# Patient Record
Sex: Female | Born: 1985 | Hispanic: No | Marital: Married | Smoking: Never smoker
Health system: Southern US, Community
[De-identification: ages and names within clinical notes are randomized; demographics above are authoritative.]

## PROBLEM LIST (undated history)

## (undated) ENCOUNTER — Inpatient Hospital Stay (HOSPITAL_COMMUNITY): Payer: Self-pay

## (undated) DIAGNOSIS — Z789 Other specified health status: Secondary | ICD-10-CM

## (undated) HISTORY — PX: NO PAST SURGERIES: SHX2092

---

## 2012-09-15 ENCOUNTER — Encounter (HOSPITAL_BASED_OUTPATIENT_CLINIC_OR_DEPARTMENT_OTHER): Payer: Self-pay | Admitting: *Deleted

## 2012-09-15 ENCOUNTER — Emergency Department (HOSPITAL_BASED_OUTPATIENT_CLINIC_OR_DEPARTMENT_OTHER): Payer: Worker's Compensation

## 2012-09-15 DIAGNOSIS — W278XXA Contact with other nonpowered hand tool, initial encounter: Secondary | ICD-10-CM | POA: Insufficient documentation

## 2012-09-15 DIAGNOSIS — Y9269 Other specified industrial and construction area as the place of occurrence of the external cause: Secondary | ICD-10-CM | POA: Insufficient documentation

## 2012-09-15 DIAGNOSIS — Y990 Civilian activity done for income or pay: Secondary | ICD-10-CM | POA: Insufficient documentation

## 2012-09-15 DIAGNOSIS — S61209A Unspecified open wound of unspecified finger without damage to nail, initial encounter: Secondary | ICD-10-CM | POA: Insufficient documentation

## 2012-09-15 DIAGNOSIS — Z23 Encounter for immunization: Secondary | ICD-10-CM | POA: Insufficient documentation

## 2012-09-15 DIAGNOSIS — Y939 Activity, unspecified: Secondary | ICD-10-CM | POA: Insufficient documentation

## 2012-09-15 NOTE — ED Notes (Signed)
Pt was at work tonight and was stabbed in the left index finger with an embroidery needle. No bleeding noted.

## 2012-09-16 ENCOUNTER — Emergency Department (HOSPITAL_BASED_OUTPATIENT_CLINIC_OR_DEPARTMENT_OTHER)
Admission: EM | Admit: 2012-09-16 | Discharge: 2012-09-16 | Disposition: A | Discharge status: Home / Self Care | Payer: Worker's Compensation | Attending: Emergency Medicine | Admitting: Emergency Medicine

## 2012-09-16 DIAGNOSIS — T148XXA Other injury of unspecified body region, initial encounter: Secondary | ICD-10-CM

## 2012-09-16 MED ORDER — CEPHALEXIN 250 MG PO CAPS: 250.0000 mg | ORAL_CAPSULE | Freq: Four times a day (QID) | ORAL | Status: DC

## 2012-09-16 MED ORDER — TETANUS-DIPHTH-ACELL PERTUSSIS 5-2.5-18.5 LF-MCG/0.5 IM SUSP
0.5000 mL | Freq: Once | INTRAMUSCULAR | Status: AC
  Administered 2012-09-16: 0.5 mL via INTRAMUSCULAR
  Filled 2012-09-16: qty 0.5

## 2012-09-16 NOTE — ED Provider Notes (Signed)
History     CSN: [id number]  Arrival date & time 09/15/12  2302   First MD Initiated Contact with Patient 09/16/12 0209      Chief Complaint  Patient presents with  . Finger Injury    (Consider location/radiation/quality/duration/timing/severity/associated sxs/prior treatment) Patient is a 27 y.o. female presenting with hand pain. The history is provided by the patient.  Hand Pain This is a new problem. The current episode started less than 1 hour ago. The problem occurs constantly. The problem has not changed since onset.Pertinent negatives include no chest pain, no abdominal pain, no headaches and no shortness of breath. Nothing aggravates the symptoms. Nothing relieves the symptoms. She has tried nothing for the symptoms. The treatment provided no relief.  Pricked left index finger with sewing machine needle.  No pain nor bleeding currently  History reviewed. No pertinent past medical history.  History reviewed. No pertinent past surgical history.  No family history on file.  History  Substance Use Topics  . Smoking status: Never Smoker   . Smokeless tobacco: Not on file  . Alcohol Use: No    OB History    Grav Para Term Preterm Abortions TAB SAB Ect Mult Living                  Review of Systems  Respiratory: Negative for shortness of breath.   Cardiovascular: Negative for chest pain.  Gastrointestinal: Negative for abdominal pain.  Neurological: Negative for headaches.  All other systems reviewed and are negative.    Allergies  Review of patient's allergies indicates no known allergies.  Home Medications  No current outpatient prescriptions on file.  BP 124/66  Pulse 72  Temp 98.1 F (36.7 C) (Oral)  Resp 18  SpO2 100%  LMP 09/08/2012  Physical Exam  Constitutional: She is oriented to person, place, and time. She appears well-developed and well-nourished. No distress.  HENT:  Head: Normocephalic and atraumatic.  Eyes: Conjunctivae normal are  normal. Pupils are equal, round, and reactive to light.  Neck: Normal range of motion. Neck supple.  Cardiovascular: Normal rate, regular rhythm and intact distal pulses.   Pulmonary/Chest: Effort normal and breath sounds normal. She has no wheezes. She has no rales.  Abdominal: Soft. Bowel sounds are normal. There is no tenderness.  Musculoskeletal: Normal range of motion.  Neurological: She is alert and oriented to person, place, and time.  Skin: Skin is warm and dry.  Psychiatric: She has a normal mood and affect.    ED Course  Procedures (including critical care time)  Labs Reviewed - No data to display Dg Finger Index Left  09/16/2012  *RADIOLOGY REPORT*  Clinical Data: Needle injury.  LEFT INDEX FINGER 2+V  Comparison: None.  Findings: The mineralization and alignment are normal.  There is no evidence of acute fracture or dislocation.  There is no evidence of metallic foreign body or soft tissue emphysema.  There is no bone destruction.  IMPRESSION: No acute osseous findings or foreign body.   Original Report Authenticated By: Carey Bullocks, M.D.      No diagnosis found.    MDM  Prick to tip of the left index finger.  Tetanus updated wound care and bacitracin applied.  Apply neosporin to wound.  Will give rx for keflex        [name], MD 09/16/12 [phone]

## 2014-12-22 ENCOUNTER — Ambulatory Visit (INDEPENDENT_AMBULATORY_CARE_PROVIDER_SITE_OTHER): Payer: 59 | Admitting: Physician Assistant

## 2014-12-22 VITALS — BP 110/80 | HR 63 | Temp 97.8°F | Ht 65.0 in | Wt 141.4 lb

## 2014-12-22 DIAGNOSIS — L309 Dermatitis, unspecified: Secondary | ICD-10-CM

## 2014-12-22 MED ORDER — TRIAMCINOLONE ACETONIDE 0.025 % EX OINT: 1.0000 "application " | TOPICAL_OINTMENT | Freq: Two times a day (BID) | CUTANEOUS | Status: DC

## 2014-12-22 MED ORDER — KETOCONAZOLE 2 % EX CREA: 1.0000 "application " | TOPICAL_CREAM | Freq: Every day | CUTANEOUS | Status: DC

## 2014-12-22 NOTE — Progress Notes (Signed)
   Subjective:    Patient ID: Carly [name], female    DOB: 09/03/1986, 29 y.o.   MRN: [id number]  Chief Complaint  Patient presents with  . Facial Pain    Reports of facial dryness-?allergies   There are no active problems to display for this patient.  Medications, allergies, past medical history, surgical history, family history, social history and problem list reviewed and updated.  HPI  29 yof with no significant pmh presents with facial dryness, itching past 3 wks.   Initially felt face getting dry few wks ago, tried different lotions but all bothered her. Past week has felt like face is mildly itchy and burning. Both cheeks.   Denies uri sx. Denies st, trouble swallowing, swollen lips. Left eye was puffy last week but better today. Denies fatigue, fever, wt loss, muscle aches, painful oral lesions. No fam hx lupus. Denies joint pains.   Denies dry skin, burning elsewhere.   Review of Systems See HPI.     Objective:   Physical Exam  Constitutional: She appears well-developed and well-nourished.  Non-toxic appearance. She does not have a sickly appearance. She does not appear ill. No distress.  BP 110/80 mmHg  Pulse 63  Temp(Src) 97.8 F (36.6 C) (Oral)  Ht 5\' 5"  (1.651 m)  Wt 141 lb 6 oz (64.127 kg)  BMI 23.53 kg/m2  SpO2 98%   HENT:  Head:    Right Ear: Tympanic membrane normal.  Left Ear: Tympanic membrane normal.  Nose: Nose normal.  Mouth/Throat: Uvula is midline and oropharynx is clear and moist.  Mild erythema, papules, dry skin over circled areas. No crusting, greasy lesions, purulence.   Eyes: Conjunctivae and EOM are normal. Pupils are equal, round, and reactive to light.      Assessment & Plan:   29 yof with no significant pmh presents with facial dryness, itching past 3 wks.   Dermatitis - Plan: triamcinolone (KENALOG) 0.025 % ointment, ketoconazole (NIZORAL) 2 % cream --triamcinolone (low potency) bid 1-2 wks over irritated areas, pt instructed not  to extend past this time as can thin skin in the area --ketoconazole qd next 1-2 wks as pt states this has provided some relief in the past --daily moisturizer like eucerin after showers, face washing --daily zyrtec --rtc 2 wks if not improving  Donnajean Lopesodd M. Armya Westerhoff, PA-C Physician Assistant-Certified [facility]  12/23/2014 7:39 PM

## 2014-12-22 NOTE — Patient Instructions (Addendum)
Please apply the steroid topical cream twice daily for the next 1-2 weeks.  Please apply the ketoconazole topical once daily for the next 1-2 weeks. Be sure to apply moisturizer such as eucerin or cetaphil twice daily especially after showers every day. Please take an oral antihistamine like zyrtec every day for the next few weeks as this may help with the irritation.  If you're not feeling better in 2 weeks after these measures please come back to see us for further workup.

## 2014-12-23 ENCOUNTER — Encounter: Payer: Self-pay | Admitting: Physician Assistant

## 2015-05-31 ENCOUNTER — Inpatient Hospital Stay (HOSPITAL_COMMUNITY): Payer: 59

## 2015-05-31 ENCOUNTER — Encounter (HOSPITAL_COMMUNITY): Payer: Self-pay | Admitting: *Deleted

## 2015-05-31 ENCOUNTER — Inpatient Hospital Stay (HOSPITAL_COMMUNITY)
Admission: AD | Admit: 2015-05-31 | Discharge: 2015-06-01 | Disposition: A | Discharge status: Home / Self Care | Payer: 59 | Source: Ambulatory Visit | Attending: Obstetrics and Gynecology | Admitting: Obstetrics and Gynecology

## 2015-05-31 DIAGNOSIS — O200 Threatened abortion: Secondary | ICD-10-CM | POA: Insufficient documentation

## 2015-05-31 DIAGNOSIS — R109 Unspecified abdominal pain: Secondary | ICD-10-CM | POA: Diagnosis present

## 2015-05-31 DIAGNOSIS — R58 Hemorrhage, not elsewhere classified: Secondary | ICD-10-CM

## 2015-05-31 DIAGNOSIS — Z3A01 Less than 8 weeks gestation of pregnancy: Secondary | ICD-10-CM | POA: Insufficient documentation

## 2015-05-31 HISTORY — DX: Other specified health status: Z78.9

## 2015-05-31 LAB — CBC WITH DIFFERENTIAL/PLATELET
BASOS ABS: 0 10*3/uL (ref 0.0–0.1)
Basophils Relative: 0 %
EOS PCT: 5 %
Eosinophils Absolute: 0.5 10*3/uL (ref 0.0–0.7)
HEMATOCRIT: 34 % — AB (ref 36.0–46.0)
Hemoglobin: 11.4 g/dL — ABNORMAL LOW (ref 12.0–15.0)
LYMPHS PCT: 23 %
Lymphs Abs: 2.7 10*3/uL (ref 0.7–4.0)
MCH: 29.5 pg (ref 26.0–34.0)
MCHC: 33.5 g/dL (ref 30.0–36.0)
MCV: 88.1 fL (ref 78.0–100.0)
MONO ABS: 0.9 10*3/uL (ref 0.1–1.0)
MONOS PCT: 7 %
NEUTROS ABS: 7.7 10*3/uL (ref 1.7–7.7)
Neutrophils Relative %: 65 %
PLATELETS: 254 10*3/uL (ref 150–400)
RBC: 3.86 MIL/uL — ABNORMAL LOW (ref 3.87–5.11)
RDW: 12.9 % (ref 11.5–15.5)
WBC: 11.8 10*3/uL — ABNORMAL HIGH (ref 4.0–10.5)

## 2015-05-31 LAB — URINE MICROSCOPIC-ADD ON

## 2015-05-31 LAB — POCT PREGNANCY, URINE: PREG TEST UR: POSITIVE — AB

## 2015-05-31 LAB — URINALYSIS, ROUTINE W REFLEX MICROSCOPIC
BILIRUBIN URINE: NEGATIVE
GLUCOSE, UA: NEGATIVE mg/dL
Ketones, ur: NEGATIVE mg/dL
Nitrite: NEGATIVE
Protein, ur: NEGATIVE mg/dL
SPECIFIC GRAVITY, URINE: 1.02 (ref 1.005–1.030)
UROBILINOGEN UA: 0.2 mg/dL (ref 0.0–1.0)
pH: 6 (ref 5.0–8.0)

## 2015-05-31 LAB — HCG, QUANTITATIVE, PREGNANCY: hCG, Beta Chain, Quant, S: 3855 m[IU]/mL — ABNORMAL HIGH (ref <5)

## 2015-05-31 NOTE — Discharge Instructions (Signed)
Abnormal Uterine Bleeding °Abnormal uterine bleeding can affect women at various stages in life, including teenagers, women in their reproductive years, pregnant women, and women who have reached menopause. Several kinds of uterine bleeding are considered abnormal, including: °· Bleeding or spotting between periods.   °· Bleeding after sexual intercourse.   °· Bleeding that is heavier or more than normal.   °· Periods that last longer than usual. °· Bleeding after menopause.   °Many cases of abnormal uterine bleeding are minor and simple to treat, while others are more serious. Any type of abnormal bleeding should be evaluated by your health care provider. Treatment will depend on the cause of the bleeding. °HOME CARE INSTRUCTIONS °Monitor your condition for any changes. The following actions may help to alleviate any discomfort you are experiencing: °· Avoid the use of tampons and douches as directed by your health care provider. °· Change your pads frequently. °You should get regular pelvic exams and Pap tests. Keep all follow-up appointments for diagnostic tests as directed by your health care provider.  °SEEK MEDICAL CARE IF:  °· Your bleeding lasts more than 1 week.   °· You feel dizzy at times.   °SEEK IMMEDIATE MEDICAL CARE IF:  °· You pass out.   °· You are changing pads every 15 to 30 minutes.   °· You have abdominal pain. °· You have a fever.   °· You become sweaty or weak.   °· You are passing large blood clots from the vagina.   °· You start to feel nauseous and vomit. °MAKE SURE YOU:  °· Understand these instructions. °· Will watch your condition. °· Will get help right away if you are not doing well or get worse. °Document Released: 08/31/2005 Document Revised: 09/05/2013 Document Reviewed: 03/30/2013 °ExitCare® Patient Information ©2015 [employer]. This information is not intended to replace advice given to you by your health care provider. Make sure you discuss any questions you have with your  health care provider. °Miscarriage °A miscarriage is the sudden loss of an unborn baby (fetus) before the 20th week of pregnancy. Most miscarriages happen in the first 3 months of pregnancy. Sometimes, it happens before a woman even knows she is pregnant. A miscarriage is also called a "spontaneous miscarriage" or "early pregnancy loss." Having a miscarriage can be an emotional experience. Talk with your caregiver about any questions you may have about miscarrying, the grieving process, and your future pregnancy plans. °CAUSES  °· Problems with the fetal chromosomes that make it impossible for the baby to develop normally. Problems with the baby's genes or chromosomes are most often the result of errors that occur, by chance, as the embryo divides and grows. The problems are not inherited from the parents. °· Infection of the cervix or uterus.   °· Hormone problems.   °· Problems with the cervix, such as having an incompetent cervix. This is when the tissue in the cervix is not strong enough to hold the pregnancy.   °· Problems with the uterus, such as an abnormally shaped uterus, uterine fibroids, or congenital abnormalities.   °· Certain medical conditions.   °· Smoking, drinking alcohol, or taking illegal drugs.   °· Trauma.   °Often, the cause of a miscarriage is unknown.  °SYMPTOMS  °· Vaginal bleeding or spotting, with or without cramps or pain. °· Pain or cramping in the abdomen or lower back. °· Passing fluid, tissue, or blood clots from the vagina. °DIAGNOSIS  °Your caregiver will perform a physical exam. You may also have an ultrasound to confirm the miscarriage. Blood or urine tests may also   be ordered. °TREATMENT  °· Sometimes, treatment is not necessary if you naturally pass all the fetal tissue that was in the uterus. If some of the fetus or placenta remains in the body (incomplete miscarriage), tissue left behind may become infected and must be removed. Usually, a dilation and curettage (D and C)  procedure is performed. During a D and C procedure, the cervix is widened (dilated) and any remaining fetal or placental tissue is gently removed from the uterus. °· Antibiotic medicines are prescribed if there is an infection. Other medicines may be given to reduce the size of the uterus (contract) if there is a lot of bleeding. °· If you have Rh negative blood and your baby was Rh positive, you will need a Rh immunoglobulin shot. This shot will protect any future baby from having Rh blood problems in future pregnancies. °HOME CARE INSTRUCTIONS  °· Your caregiver may order bed rest or may allow you to continue light activity. Resume activity as directed by your caregiver. °· Have someone help with home and family responsibilities during this time.   °· Keep track of the number of sanitary pads you use each day and how soaked (saturated) they are. Write down this information.   °· Do not use tampons. Do not douche or have sexual intercourse until approved by your caregiver.   °· Only take over-the-counter or prescription medicines for pain or discomfort as directed by your caregiver.   °· Do not take aspirin. Aspirin can cause bleeding.   °· Keep all follow-up appointments with your caregiver.   °· If you or your partner have problems with grieving, talk to your caregiver or seek counseling to help cope with the pregnancy loss. Allow enough time to grieve before trying to get pregnant again.   °SEEK IMMEDIATE MEDICAL CARE IF:  °· You have severe cramps or pain in your back or abdomen. °· You have a fever. °· You pass large blood clots (walnut-sized or larger) or tissue from your vagina. Save any tissue for your caregiver to inspect.   °· Your bleeding increases.   °· You have a thick, bad-smelling vaginal discharge. °· You become lightheaded, weak, or you faint.   °· You have chills.   °MAKE SURE YOU: °· Understand these instructions. °· Will watch your condition. °· Will get help right away if you are not doing  well or get worse. °Document Released: 02/24/2001 Document Revised: 12/26/2012 Document Reviewed: 10/20/2011 °ExitCare® Patient Information ©2015 [employer]. This information is not intended to replace advice given to you by your health care provider. Make sure you discuss any questions you have with your health care provider. ° °

## 2015-05-31 NOTE — MAU Provider Note (Signed)
Carly [name] is a 29 y.o. NOB visit schedule for 06/11/15.  GA 7.1 week by date. Ultrasound 05/30/15 - [facility]  [redacted] weeks gestation with FHR = 67 bpm.  She presents to MAU to c/o increase bleeding and cramping today but denies cramping since she arrived in MAU.  Quant on 05/29/15   6272.2.     History     There are no active problems to display for this patient.   Chief Complaint  Patient presents with  . Abdominal Pain  . Vaginal Bleeding   HPI  OB History    Gravida Para Term Preterm AB TAB SAB Ectopic Multiple Living   1               Past Medical History  Diagnosis Date  . Medical history non-contributory     Past Surgical History  Procedure Laterality Date  . No past surgeries      Family History  Problem Relation Age of Onset  . Cancer Maternal Grandfather   . Heart disease Paternal Grandfather     Social History  Substance Use Topics  . Smoking status: Never Smoker   . Smokeless tobacco: None  . Alcohol Use: No    Allergies: No Known Allergies  Prescriptions prior to admission  Medication Sig Dispense Refill Last Dose  . acetaminophen (TYLENOL) 500 MG tablet Take 500 mg by mouth every 6 (six) hours as needed.   05/31/2015 at 1630  . Prenatal Vit-Fe Fumarate-FA (PRENATAL MULTIVITAMIN) TABS tablet Take 1 tablet by mouth daily at 12 noon.   05/31/2015 at Unknown time  . ketoconazole (NIZORAL) 2 % cream Apply 1 application topically daily. 15 g 0   . triamcinolone (KENALOG) 0.025 % ointment Apply 1 application topically 2 (two) times daily. 30 g 0     ROS See HPI above, all other systems are negative  Physical Exam   Blood pressure 114/68, pulse 78, temperature 98.6 F (37 C), resp. rate 18, height 5\' 5"  (1.651 m), weight 143 lb (64.864 kg), last menstrual period 04/10/2015.  Physical Exam Ext:  WNL ABD: Soft, non tender to palpation, no rebound or guarding SSE: some bleeding observed, 1-2 cm firm round tissue like substance with trailing tissue in the  vaginal vault, which expelled with very little effort   ED Course  Assessment: [facility]UP at  6 weeks by [place] 05/30/15 Suspected miscarriage     Plan: Labs: CBC, Quant [place] for confirmation of SAB Tissue sample sent to pathology   Venus Standard, CNM, MSN 05/31/2015. 11:07 PM    MAU Addendum Note  Results for orders placed or performed during the hospital encounter of 05/31/15 (from the past 24 hour(s))  Urinalysis, Routine w reflex microscopic (not at [facility])     Status: Abnormal   Collection Time: 05/31/15  8:45 PM  Result Value Ref Range   Color, Urine ORANGE (A) YELLOW   APPearance CLEAR CLEAR   Specific Gravity, Urine 1.020 1.005 - 1.030   pH 6.0 5.0 - 8.0   Glucose, UA NEGATIVE NEGATIVE mg/dL   Hgb urine dipstick LARGE (A) NEGATIVE   Bilirubin Urine NEGATIVE NEGATIVE   Ketones, ur NEGATIVE NEGATIVE mg/dL   Protein, ur NEGATIVE NEGATIVE mg/dL   Urobilinogen, UA 0.2 0.0 - 1.0 mg/dL   Nitrite NEGATIVE NEGATIVE   Leukocytes, UA TRACE (A) NEGATIVE  Urine microscopic-add on     Status: Abnormal   Collection Time: 05/31/15  8:45 PM  Result Value Ref Range   Squamous Epithelial /  LPF RARE RARE   WBC, UA 3-6 <3 WBC/hpf   RBC / HPF 21-50 <3 RBC/hpf   Bacteria, UA FEW (A) RARE  Pregnancy, urine POC     Status: Abnormal   Collection Time: 05/31/15  8:52 PM  Result Value Ref Range   Preg Test, Ur POSITIVE (A) NEGATIVE  CBC with Differential/Platelet     Status: Abnormal   Collection Time: 05/31/15  9:19 PM  Result Value Ref Range   WBC 11.8 (H) 4.0 - 10.5 K/uL   RBC 3.86 (L) 3.87 - 5.11 MIL/uL   Hemoglobin 11.4 (L) 12.0 - 15.0 g/dL   HCT 30.8 (L) 65.7 - 84.6 %   MCV 88.1 78.0 - 100.0 fL   MCH 29.5 26.0 - 34.0 pg   MCHC 33.5 30.0 - 36.0 g/dL   RDW 96.2 95.2 - 84.1 %   Platelets 254 150 - 400 K/uL   Neutrophils Relative % 65 %   Neutro Abs 7.7 1.7 - 7.7 K/uL   Lymphocytes Relative 23 %   Lymphs Abs 2.7 0.7 - 4.0 K/uL   Monocytes Relative 7 %   Monocytes Absolute 0.9 0.1 -  1.0 K/uL   Eosinophils Relative 5 %   Eosinophils Absolute 0.5 0.0 - 0.7 K/uL   Basophils Relative 0 %   Basophils Absolute 0.0 0.0 - 0.1 K/uL  hCG, quantitative, pregnancy     Status: Abnormal   Collection Time: 05/31/15  9:19 PM  Result Value Ref Range   hCG, Beta Chain, Quant, S 3855 (H) <5 mIU/mL   [place] results No intrauterine pregnancy. Thickened endometrium, without blood flow to suggest retained products. Findings are consistent with failed pregnancy.  Plan: -Discussed need to follow up in office in  5 days for FU quant -Bleeding Precautions -Encouraged to call if any questions or concerns arise prior to next scheduled office visit.  -Discharged to home in stable condition -pt encourage to delay conception for 3-4 months    Venus Standard, CNM, MSN 05/31/2015. 11:07 PM

## 2015-05-31 NOTE — Progress Notes (Signed)
V. Standard CNM aware u/s results back and coming to discuss with pt

## 2015-05-31 NOTE — MAU Note (Signed)
About 2 days ago i started spotting alittle. Was seen at [facility] and had u/s and told baby in uterus. Bleeding more today and cramping. Took Tylenol and helping cramping but worried that bleeding is heavier.

## 2015-06-01 NOTE — Progress Notes (Signed)
V. Standard CNM in earlier to discuss test results. Written and verbal d/c instructions given and understanding voiced.

## 2015-09-06 LAB — OB RESULTS CONSOLE ABO/RH: RH TYPE: POSITIVE

## 2015-09-06 LAB — OB RESULTS CONSOLE RPR: RPR: NONREACTIVE

## 2015-09-06 LAB — OB RESULTS CONSOLE ANTIBODY SCREEN: Antibody Screen: NEGATIVE

## 2015-09-06 LAB — OB RESULTS CONSOLE HIV ANTIBODY (ROUTINE TESTING): HIV: NONREACTIVE

## 2015-09-06 LAB — OB RESULTS CONSOLE HEPATITIS B SURFACE ANTIGEN: HEP B S AG: NEGATIVE

## 2015-09-06 LAB — OB RESULTS CONSOLE RUBELLA ANTIBODY, IGM: RUBELLA: IMMUNE

## 2015-09-12 LAB — OB RESULTS CONSOLE GC/CHLAMYDIA
Chlamydia: NEGATIVE
Gonorrhea: NEGATIVE

## 2015-09-15 NOTE — L&D Delivery Note (Signed)
Pt was admitted with SROM.She had pit aug and progressed along a nl labor curve. She had PCN for +GBS. She pushed for 40 min and had a SVD of one live viable black female infant over a second degree midline tear in the ROA position. Placenta-S/I. EBL-400cc. Tear closed with 3-0 chromic. Baby to NBN.

## 2015-11-10 ENCOUNTER — Encounter (HOSPITAL_COMMUNITY): Payer: Self-pay | Admitting: *Deleted

## 2015-11-10 ENCOUNTER — Inpatient Hospital Stay (HOSPITAL_COMMUNITY)
Admission: AD | Admit: 2015-11-10 | Discharge: 2015-11-10 | Disposition: A | Discharge status: Home / Self Care | Payer: 59 | Source: Ambulatory Visit | Attending: Obstetrics and Gynecology | Admitting: Obstetrics and Gynecology

## 2015-11-10 DIAGNOSIS — E860 Dehydration: Secondary | ICD-10-CM | POA: Diagnosis not present

## 2015-11-10 DIAGNOSIS — B9689 Other specified bacterial agents as the cause of diseases classified elsewhere: Secondary | ICD-10-CM | POA: Diagnosis present

## 2015-11-10 DIAGNOSIS — O26892 Other specified pregnancy related conditions, second trimester: Secondary | ICD-10-CM | POA: Diagnosis present

## 2015-11-10 DIAGNOSIS — A499 Bacterial infection, unspecified: Secondary | ICD-10-CM | POA: Diagnosis not present

## 2015-11-10 DIAGNOSIS — B373 Candidiasis of vulva and vagina: Secondary | ICD-10-CM | POA: Diagnosis not present

## 2015-11-10 DIAGNOSIS — Z79899 Other long term (current) drug therapy: Secondary | ICD-10-CM | POA: Insufficient documentation

## 2015-11-10 DIAGNOSIS — Z3A15 15 weeks gestation of pregnancy: Secondary | ICD-10-CM | POA: Insufficient documentation

## 2015-11-10 DIAGNOSIS — N760 Acute vaginitis: Secondary | ICD-10-CM | POA: Diagnosis present

## 2015-11-10 DIAGNOSIS — B3731 Acute candidiasis of vulva and vagina: Secondary | ICD-10-CM | POA: Diagnosis present

## 2015-11-10 LAB — URINALYSIS, ROUTINE W REFLEX MICROSCOPIC
BILIRUBIN URINE: NEGATIVE
GLUCOSE, UA: NEGATIVE mg/dL
Ketones, ur: 15 mg/dL — AB
Nitrite: NEGATIVE
Protein, ur: 30 mg/dL — AB
pH: 6 (ref 5.0–8.0)

## 2015-11-10 LAB — WET PREP, GENITAL
Sperm: NONE SEEN
TRICH WET PREP: NONE SEEN

## 2015-11-10 LAB — URINE MICROSCOPIC-ADD ON

## 2015-11-10 MED ORDER — TERCONAZOLE 0.4 % VA CREA: 1.0000 | TOPICAL_CREAM | Freq: Every day | VAGINAL | Status: DC

## 2015-11-10 MED ORDER — HYDROCORTISONE 1 % EX CREA
TOPICAL_CREAM | CUTANEOUS | Status: AC
  Administered 2015-11-10: 17:00:00 via TOPICAL
  Filled 2015-11-10: qty 28

## 2015-11-10 MED ORDER — METRONIDAZOLE 500 MG PO TABS: 500.0000 mg | ORAL_TABLET | Freq: Two times a day (BID) | ORAL | Status: DC

## 2015-11-10 NOTE — Discharge Instructions (Signed)
Candidiasis and Breastfeeding The Candida organism is a fungus that lives in our bodies. It produces yeast cells and is kept at healthy levels by the natural bacteria in our bodies. Candida lives in warm, dark, and moist places of the body, such as skin folds under the breast and wet nipples covered by bras or nursing bra pads. When your body's natural balance of bacteria is upset, Candidacan overgrow, causing an infection. This type of infection is called candidiasis. WHAT ARE THE RISK FACTORS FOR DEVELOPING CANDIDIASIS IF I AM BREASTFEEDING? You may be at higher risk for developing candidiasis if you or your baby has been taking antibiotic medicines, your nipples are cracked, or you are taking oral contraceptives or steroids (such as for asthma). WHAT ARE THE SYMPTOMS OF CANDIDIASIS?  Severe stinging or burning pain, which may be on the surface of the nipples or may be felt deep inside the breast.   Pain during, in between, or especially right after feedings.   Sharp, shooting pain that spreads (radiates) from the nipple into the breast or into the back or arm.   Nipples suddenly become sore after the first two weeks after you give birth.   Sensitive nipples that may have pain with even a light touch. Nipples may also be:   Puffy.  Weepy.   Itchy.   Blistering.  Cracked.  Scaly.   Reddish.  Shiny.  Flaky.  HOW IS CANDIDIASIS DIAGNOSED WHEN I AM BREASTFEEDING? The diagnosis is often made based on the symptoms. Microscopic evaluation of breast discharge or cultures may be needed.  HOW IS CANDIDIASIS TREATED WHEN I AM BREASTFEEDING?  Yeast can be passed back and forth between a mother and her baby. The mother and baby may need treatment at the same time in order to clear up the infection, even if one does not have symptoms. Occasionally other family members (especially your sexual partner) may need to be treated at the same time. Treatment may involve:   Applying  antifungal cream to your nipples after each feeding.  Washing your nipples with warm water before nursing.   Stopping nursing from the affected breast and using a breast pump.   Keeping the affected breast empty of milk with nursing or with a breast pump.   Medicine. This may be given if your baby has thrush or diaper rash. If you are nursing and you have candidiasis, your baby should be treated for thrush even if you cannot see any white patches in the baby's mouth.   If your infection is more severe, you may be prescribed medicines by mouth.  Talk to your health care provider before starting treatment. It is important to begin treatment only after making sure other things are not the cause of the problem.  WHAT CAN I DO AT HOME? Usually after 24-48 hours, you should feel some improvement. In some cases, symptoms may get worse before they get better.   Only take medicines as directed by your health care provider. Make sure to finish all your medicines.   Only take over-the-counter or prescription medicines for pain, discomfort, or fever as directed by your health care provider.   Give your child medicines as directed by your health care provider. Make sure your child finishes them as directed by your health care provider.   Use creams or ointments as suggested by your health care provider.   Make sure your baby is seen and treated at the same time as you.   Wash your hands often.  Wash them before and after nursing and changing your baby's diaper and after using the bathroom. Use hot, soapy water. Use soft towels or cloths to pat yourself dry.  Wash your baby's hands often, especially if he or she sucks on his or her fingers.   If your baby uses a pacifier, it should be boiled for 20 minutes a day and replaced every week.  Nurse more often but for shorter periods of time. Start nursing on the least sore side.  Wash your breast pump and all its parts thoroughly in a bleach  solution. Boil all parts that touch the milk (except the rubber gaskets).   If nursing becomes too painful, you may want to pump your milk temporarily and feed it to your baby. Do not save or freeze this milk because if given to the baby after treatment is completed, it could cause the infection to return.  Eat yogurt that has live active cultures and take oral acidophilus.   Air dry your nipples after nursing.   Change bra pads after each feeding.   Wear 100% cotton bras and wash them every day in hot water.   Wash any towels or clothing that comes in contact with the infected area in very hot water (above 122F [50C]). WHEN SHOULD I SEEK MEDICAL CARE? Seek medical care if:  You or your baby are not getting better or are getting worse with the treatment.   Your breasts develop shooting pains, discomfort, itching, or burning after you take antibiotics.  WHEN SHOULD I SEEK IMMEDIATE MEDICAL CARE?  Seek immediate medical care if:  You have a fever or persistent symptoms for more than 2-3 days.   You have a fever and your symptoms suddenly get worse.   You develop swelling and severe pain in your breast.   You develop blisters on your breast.   You feel a lump in your breast, with or without pain.   Your nipple starts bleeding. MAKE SURE YOU:   Understand these instructions.  Will watch your condition.  Will get help right away if you are not doing well or get worse.   This information is not intended to replace advice given to you by your health care provider. Make sure you discuss any questions you have with your health care provider.   Document Released: 12/26/2004 Document Revised: 05/03/2013 Document Reviewed: 02/22/2013 Elsevier Interactive Patient Education 2016 [employer] Inc[employer] Bacterial Vaginosis Bacterial vaginosis is a vaginal infection that occurs when the normal balance of bacteria in the vagina is disrupted. It results from an overgrowth of  certain bacteria. This is the most common vaginal infection in women of childbearing age. Treatment is important to prevent complications, especially in pregnant women, as it can cause a premature delivery. CAUSES  Bacterial vaginosis is caused by an increase in harmful bacteria that are normally present in smaller amounts in the vagina. Several different kinds of bacteria can cause bacterial vaginosis. However, the reason that the condition develops is not fully understood. RISK FACTORS Certain activities or behaviors can put you at an increased risk of developing bacterial vaginosis, including:  Having a new sex partner or multiple sex partners.  Douching.  Using an intrauterine device (IUD) for contraception. Women do not get bacterial vaginosis from toilet seats, bedding, swimming pools, or contact with objects around them. SIGNS AND SYMPTOMS  Some women with bacterial vaginosis have no signs or symptoms. Common symptoms include:  Grey vaginal discharge.  A fishlike odor with discharge, especially after  sexual intercourse.  Itching or burning of the vagina and vulva.  Burning or pain with urination. DIAGNOSIS  Your health care provider will take a medical history and examine the vagina for signs of bacterial vaginosis. A sample of vaginal fluid may be taken. Your health care provider will look at this sample under a microscope to check for bacteria and abnormal cells. A vaginal pH test may also be done.  TREATMENT  Bacterial vaginosis may be treated with antibiotic medicines. These may be given in the form of a pill or a vaginal cream. A second round of antibiotics may be prescribed if the condition comes back after treatment. Because bacterial vaginosis increases your risk for sexually transmitted diseases, getting treated can help reduce your risk for chlamydia, gonorrhea, HIV, and herpes. HOME CARE INSTRUCTIONS   Only take over-the-counter or prescription medicines as directed by  your health care provider.  If antibiotic medicine was prescribed, take it as directed. Make sure you finish it even if you start to feel better.  Tell all sexual partners that you have a vaginal infection. They should see their health care provider and be treated if they have problems, such as a mild rash or itching.  During treatment, it is important that you follow these instructions:  Avoid sexual activity or use condoms correctly.  Do not douche.  Avoid alcohol as directed by your health care provider.  Avoid breastfeeding as directed by your health care provider. SEEK MEDICAL CARE IF:   Your symptoms are not improving after 3 days of treatment.  You have increased discharge or pain.  You have a fever. MAKE SURE YOU:   Understand these instructions.  Will watch your condition.  Will get help right away if you are not doing well or get worse. FOR MORE INFORMATION  Centers for Disease Control and Prevention, Division of STD Prevention: [employer] American Sexual Health Association (ASHA): www.ashastd.org    This information is not intended to replace advice given to you by your health care provider. Make sure you discuss any questions you have with your health care provider.   Document Released: 08/31/2005 Document Revised: 09/21/2014 Document Reviewed: 04/12/2013 Elsevier Interactive Patient Education 2016 Elsevier Inc. Dehydration, Adult Dehydration means your body does not have as much fluid or water as it needs. It happens when you take in less fluid than you lose. Your kidneys, brain, and heart will not work properly without the right amount of fluids.  Dehydration can range from mild to severe. It should be treated right away to help prevent it from becoming severe. HOME CARE  Drink enough fluid to keep your pee (urine) clear or pale yellow.  Drink water or fluid slowly by taking small sips. You can also try sucking on ice cubes.  Have food or drinks that  contain electrolytes. Examples include bananas and sports drinks.  Take over-the-counter and prescription medicines only as told by your doctor.  Prepare oral rehydration solution (ORS) according to the instructions that came with it. Take sips of ORS every 5 minutes until your pee returns to normal.  If you are throwing up (vomiting) or have watery poop (diarrhea), keep trying to drink water, ORS, or both.  If you have watery poop, avoid:  Drinks with caffeine.  Fruit juice.  Milk.  Carbonated soft drinks.  Do not take salt tablets. This can lead to having too much sodium in your body (hypernatremia). GET HELP IF:  You cannot eat or drink without throwing up.  You have had mild watery poop for longer than 24 hours.  You have a fever. GET HELP RIGHT AWAY IF:   You have very strong thirst.  You have very bad watery poop.  You have not peed in 6-8 hours, or you have peed only a small amount of very dark pee.  You have shriveled skin.  You are dizzy, confused, or both.   This information is not intended to replace advice given to you by your health care provider. Make sure you discuss any questions you have with your health care provider.   Document Released: 06/27/2009 Document Revised: 05/22/2015 Document Reviewed: 01/16/2015 Elsevier Interactive Patient Education [employer]

## 2015-11-10 NOTE — MAU Note (Signed)
Pt. States is having some anal itching and she has felt a lump around her anus/

## 2015-11-10 NOTE — MAU Provider Note (Signed)
History     Carly [name], Carly [name] is a 30 yo, G2P0, at 15.5 wks presenting to the MAU unannounced for anal itching and "lumps" around her anus.    Pt  Is in the process of changing her care to another OB/GYN.  She was recently seen in the [facility] on 10/30/15 and was diagnosed with a UTI and yeast infections.   Pt was Rxd Nitrofurantoin and Terazole. Still taking ABx for UTI.   States she was told to only use the Terazole for 3 days. Currently complains of vaginal itching symptoms.     There are no active problems to display for this patient.   No chief complaint on file.  HPI  OB History    Gravida Para Term Preterm AB TAB SAB Ectopic Multiple Living   2               Past Medical History  Diagnosis Date  . Medical history non-contributory     Past Surgical History  Procedure Laterality Date  . No past surgeries      Family History  Problem Relation Age of Onset  . Cancer Maternal Grandfather   . Heart disease Paternal Grandfather     Social History  Substance Use Topics  . Smoking status: Never Smoker   . Smokeless tobacco: None  . Alcohol Use: No    Allergies: No Known Allergies  Prescriptions prior to admission  Medication Sig Dispense Refill Last Dose  . Prenatal Vit-Fe Fumarate-FA (PRENATAL MULTIVITAMIN) TABS tablet Take 1 tablet by mouth at bedtime.    Past Week at Unknown time    ROS Physical Exam   Blood pressure 106/63, pulse 81, temperature 98.1 F (36.7 C), temperature source Oral, height  (1.651 m), weight 65.772 kg (145 lb), last menstrual period 07/23/2015.  Filed Vitals:   11/10/15 1539  BP: 106/63  Pulse: 81  Temp: 98.1 F (36.7 C)  TempSrc: Oral  Height:  (1.651 m)  Weight: 65.772 kg (145 lb)   Results for orders placed or performed during the hospital encounter of 11/10/15 (from the past 24 hour(s))  Urinalysis, Routine w reflex microscopic (not at [facility])     Status: Abnormal   Collection Time: 11/10/15  3:25 PM  Result Value Ref  Range   Color, Urine YELLOW YELLOW   APPearance CLOUDY (A) CLEAR   Specific Gravity, Urine >1.030 (H) 1.005 - 1.030   pH 6.0 5.0 - 8.0   Glucose, UA NEGATIVE NEGATIVE mg/dL   Hgb urine dipstick MODERATE (A) NEGATIVE   Bilirubin Urine NEGATIVE NEGATIVE   Ketones, ur 15 (A) NEGATIVE mg/dL   Protein, ur 30 (A) NEGATIVE mg/dL   Nitrite NEGATIVE NEGATIVE   Leukocytes, UA MODERATE (A) NEGATIVE  Urine microscopic-add on     Status: Abnormal   Collection Time: 11/10/15  3:25 PM  Result Value Ref Range   Squamous Epithelial / LPF 0-5 (A) NONE SEEN   WBC, UA 6-30 0 - 5 WBC/hpf   RBC / HPF 0-5 0 - 5 RBC/hpf   Bacteria, UA FEW (A) NONE SEEN   Urine-Other AMORPHOUS URATES/PHOSPHATES   Wet prep, genital     Status: Abnormal   Collection Time: 11/10/15  4:25 PM  Result Value Ref Range   Yeast Wet Prep HPF POC PRESENT (A) NONE SEEN   Trich, Wet Prep NONE SEEN NONE SEEN   Clue Cells Wet Prep HPF POC PRESENT (A) NONE SEEN   WBC, Wet Prep HPF POC MANY (A) NONE  SEEN   Sperm NONE SEEN     Physical Exam  Genitourinary: Rectal exam shows external hemorrhoid. Pelvic exam was performed with patient supine. Cervix exhibits friability. There is erythema and tenderness in the vagina. Vaginal discharge found.    ED Course  Assessment: IUP 15.[redacted] wks ega FHR 140 Candida vaginitis  Bacterial Vaginosis External hemmorroids  Dehydration, Ketones +15  Plan: PO Hydration, NOW DC Home  Rx Terazo 0.4% , x7 days RX Flagyl BID, x7days Continue taking Nitrofurantoin as prescribed   Alphonzo Severance CNM, MSN 11/10/2015 4:58 PM

## 2016-03-31 ENCOUNTER — Other Ambulatory Visit: Payer: Self-pay | Admitting: Obstetrics

## 2016-03-31 LAB — OB RESULTS CONSOLE GBS: GBS: POSITIVE

## 2016-04-13 ENCOUNTER — Inpatient Hospital Stay (EMERGENCY_DEPARTMENT_HOSPITAL)
Admission: AD | Admit: 2016-04-13 | Discharge: 2016-04-13 | Disposition: A | Discharge status: Home / Self Care | Payer: 59 | Source: Ambulatory Visit | Attending: Obstetrics | Admitting: Obstetrics

## 2016-04-13 ENCOUNTER — Encounter (HOSPITAL_COMMUNITY): Payer: Self-pay

## 2016-04-13 DIAGNOSIS — Z3A37 37 weeks gestation of pregnancy: Secondary | ICD-10-CM

## 2016-04-13 DIAGNOSIS — O368130 Decreased fetal movements, third trimester, not applicable or unspecified: Secondary | ICD-10-CM

## 2016-04-13 NOTE — Discharge Instructions (Signed)
Fetal Movement Counts  Patient Name: __________________________________________________ Patient Due Date: ____________________  Performing a fetal movement count is highly recommended in high-risk pregnancies, but it is good for every pregnant woman to do. Your health care provider may ask you to start counting fetal movements at 28 weeks of the pregnancy. Fetal movements often increase:  · After eating a full meal.  · After physical activity.  · After eating or drinking something sweet or cold.  · At rest.  Pay attention to when you feel the baby is most active. This will help you notice a pattern of your baby's sleep and wake cycles and what factors contribute to an increase in fetal movement. It is important to perform a fetal movement count at the same time each day when your baby is normally most active.   HOW TO COUNT FETAL MOVEMENTS  1. Find a quiet and comfortable area to sit or lie down on your left side. Lying on your left side provides the best blood and oxygen circulation to your baby.  2. Write down the day and time on a sheet of paper or in a journal.  3. Start counting kicks, flutters, swishes, rolls, or jabs in a 2-hour period. You should feel at least 10 movements within 2 hours.  4. If you do not feel 10 movements in 2 hours, wait 2-3 hours and count again. Look for a change in the pattern or not enough counts in 2 hours.  SEEK MEDICAL CARE IF:  · You feel less than 10 counts in 2 hours, tried twice.  · There is no movement in over an hour.  · The pattern is changing or taking longer each day to reach 10 counts in 2 hours.  · You feel the baby is not moving as he or she usually does.  Date: ____________ Movements: ____________ Start time: ____________ Finish time: ____________   Date: ____________ Movements: ____________ Start time: ____________ Finish time: ____________  Date: ____________ Movements: ____________ Start time: ____________ Finish time: ____________  Date: ____________ Movements:  ____________ Start time: ____________ Finish time: ____________  Date: ____________ Movements: ____________ Start time: ____________ Finish time: ____________  Date: ____________ Movements: ____________ Start time: ____________ Finish time: ____________  Date: ____________ Movements: ____________ Start time: ____________ Finish time: ____________  Date: ____________ Movements: ____________ Start time: ____________ Finish time: ____________   Date: ____________ Movements: ____________ Start time: ____________ Finish time: ____________  Date: ____________ Movements: ____________ Start time: ____________ Finish time: ____________  Date: ____________ Movements: ____________ Start time: ____________ Finish time: ____________  Date: ____________ Movements: ____________ Start time: ____________ Finish time: ____________  Date: ____________ Movements: ____________ Start time: ____________ Finish time: ____________  Date: ____________ Movements: ____________ Start time: ____________ Finish time: ____________  Date: ____________ Movements: ____________ Start time: ____________ Finish time: ____________   Date: ____________ Movements: ____________ Start time: ____________ Finish time: ____________  Date: ____________ Movements: ____________ Start time: ____________ Finish time: ____________  Date: ____________ Movements: ____________ Start time: ____________ Finish time: ____________  Date: ____________ Movements: ____________ Start time: ____________ Finish time: ____________  Date: ____________ Movements: ____________ Start time: ____________ Finish time: ____________  Date: ____________ Movements: ____________ Start time: ____________ Finish time: ____________  Date: ____________ Movements: ____________ Start time: ____________ Finish time: ____________   Date: ____________ Movements: ____________ Start time: ____________ Finish time: ____________  Date: ____________ Movements: ____________ Start time: ____________ Finish  time: ____________  Date: ____________ Movements: ____________ Start time: ____________ Finish time: ____________  Date: ____________ Movements: ____________ Start time:   ____________ Finish time: ____________  Date: ____________ Movements: ____________ Start time: ____________ Finish time: ____________  Date: ____________ Movements: ____________ Start time: ____________ Finish time: ____________  Date: ____________ Movements: ____________ Start time: ____________ Finish time: ____________   Date: ____________ Movements: ____________ Start time: ____________ Finish time: ____________  Date: ____________ Movements: ____________ Start time: ____________ Finish time: ____________  Date: ____________ Movements: ____________ Start time: ____________ Finish time: ____________  Date: ____________ Movements: ____________ Start time: ____________ Finish time: ____________  Date: ____________ Movements: ____________ Start time: ____________ Finish time: ____________  Date: ____________ Movements: ____________ Start time: ____________ Finish time: ____________  Date: ____________ Movements: ____________ Start time: ____________ Finish time: ____________   Date: ____________ Movements: ____________ Start time: ____________ Finish time: ____________  Date: ____________ Movements: ____________ Start time: ____________ Finish time: ____________  Date: ____________ Movements: ____________ Start time: ____________ Finish time: ____________  Date: ____________ Movements: ____________ Start time: ____________ Finish time: ____________  Date: ____________ Movements: ____________ Start time: ____________ Finish time: ____________  Date: ____________ Movements: ____________ Start time: ____________ Finish time: ____________  Date: ____________ Movements: ____________ Start time: ____________ Finish time: ____________   Date: ____________ Movements: ____________ Start time: ____________ Finish time: ____________  Date: ____________  Movements: ____________ Start time: ____________ Finish time: ____________  Date: ____________ Movements: ____________ Start time: ____________ Finish time: ____________  Date: ____________ Movements: ____________ Start time: ____________ Finish time: ____________  Date: ____________ Movements: ____________ Start time: ____________ Finish time: ____________  Date: ____________ Movements: ____________ Start time: ____________ Finish time: ____________  Date: ____________ Movements: ____________ Start time: ____________ Finish time: ____________   Date: ____________ Movements: ____________ Start time: ____________ Finish time: ____________  Date: ____________ Movements: ____________ Start time: ____________ Finish time: ____________  Date: ____________ Movements: ____________ Start time: ____________ Finish time: ____________  Date: ____________ Movements: ____________ Start time: ____________ Finish time: ____________  Date: ____________ Movements: ____________ Start time: ____________ Finish time: ____________  Date: ____________ Movements: ____________ Start time: ____________ Finish time: ____________     This information is not intended to replace advice given to you by your health care provider. Make sure you discuss any questions you have with your health care provider.     Document Released: 09/30/2006 Document Revised: 09/21/2014 Document Reviewed: 06/27/2012  Elsevier Interactive Patient Education ©2016 Elsevier Inc.

## 2016-04-13 NOTE — MAU Note (Signed)
Has not felt baby move as much over the last few days.

## 2016-04-13 NOTE — MAU Provider Note (Signed)
  History     CSN: [id number]  Arrival date and time: 04/13/16 3903   First Provider Initiated Contact with Patient 04/13/16 1845      Chief Complaint  Patient presents with  . Decreased Fetal Movement   HPI Carly [name] is a 30yo G2P0010 @ 37.6wks who presents for eval of decreased FM. Denies ctx, leaking or bldg. Her preg has been followed by the [facility] office and has been essentially unremarkable. Now feeling some FM since has been in MAU.  OB History    Gravida Para Term Preterm AB Living   2       1     SAB TAB Ectopic Multiple Live Births   1              Past Medical History:  Diagnosis Date  . Medical history non-contributory     Past Surgical History:  Procedure Laterality Date  . NO PAST SURGERIES      Family History  Problem Relation Age of Onset  . Cancer Maternal Grandfather   . Heart disease Paternal Grandfather     Social History  Substance Use Topics  . Smoking status: Never Smoker  . Smokeless tobacco: Never Used  . Alcohol use No    Allergies: No Known Allergies  Prescriptions Prior to Admission  Medication Sig Dispense Refill Last Dose  . metroNIDAZOLE (FLAGYL) 500 MG tablet Take 1 tablet (500 mg total) by mouth 2 (two) times daily. 14 tablet 0   . Prenatal Vit-Fe Fumarate-FA (PRENATAL MULTIVITAMIN) TABS tablet Take 1 tablet by mouth at bedtime.    Past Week at Unknown time  . terconazole (TERAZOL 7) 0.4 % vaginal cream Place 1 applicator vaginally at bedtime. 45 g 0     ROS Physical Exam   Temperature 98.2 F (36.8 C), temperature source Oral, resp. rate 16, height 5\' 5"  (1.651 m), weight 78 kg (172 lb), last menstrual period 07/23/2015.  Physical Exam  Constitutional: She is oriented to person, place, and time. She appears well-developed.  HENT:  Head: Normocephalic.  Neck: Normal range of motion.  Cardiovascular: Normal rate.   Respiratory: Effort normal.  GI:  EFM 120s, +accels, no decels, Cat 1 No ctx per toco   Musculoskeletal: Normal range of motion.  Neurological: She is alert and oriented to person, place, and time.  Skin: Skin is warm and dry.  Psychiatric: She has a normal mood and affect. Her behavior is normal. Thought content normal.    MAU Course  Procedures  MDM NST read  Assessment and Plan  IUP@37 .6wks Decreased FM  D/C home w/ fetal kick count instructions F/U tomorrow at the office as scheduled or call sooner prn  Cam Hai CNM 04/13/2016, 6:52 PM

## 2016-04-15 ENCOUNTER — Inpatient Hospital Stay (HOSPITAL_COMMUNITY): Payer: 59 | Admitting: Anesthesiology

## 2016-04-15 ENCOUNTER — Encounter (HOSPITAL_COMMUNITY): Payer: Self-pay

## 2016-04-15 ENCOUNTER — Inpatient Hospital Stay (HOSPITAL_COMMUNITY)
Admission: AD | Admit: 2016-04-15 | Discharge: 2016-04-17 | DRG: 775 | Disposition: A | Discharge status: Home / Self Care | Payer: 59 | Source: Ambulatory Visit | Attending: Obstetrics and Gynecology | Admitting: Obstetrics and Gynecology

## 2016-04-15 DIAGNOSIS — O99824 Streptococcus B carrier state complicating childbirth: Secondary | ICD-10-CM | POA: Diagnosis present

## 2016-04-15 DIAGNOSIS — Z3A38 38 weeks gestation of pregnancy: Secondary | ICD-10-CM | POA: Diagnosis not present

## 2016-04-15 DIAGNOSIS — O4290 Premature rupture of membranes, unspecified as to length of time between rupture and onset of labor, unspecified weeks of gestation: Secondary | ICD-10-CM | POA: Diagnosis present

## 2016-04-15 DIAGNOSIS — Z3490 Encounter for supervision of normal pregnancy, unspecified, unspecified trimester: Secondary | ICD-10-CM

## 2016-04-15 LAB — CBC
HEMATOCRIT: 32.2 % — AB (ref 36.0–46.0)
Hemoglobin: 11.2 g/dL — ABNORMAL LOW (ref 12.0–15.0)
MCH: 30.4 pg (ref 26.0–34.0)
MCHC: 34.8 g/dL (ref 30.0–36.0)
MCV: 87.3 fL (ref 78.0–100.0)
Platelets: 177 10*3/uL (ref 150–400)
RBC: 3.69 MIL/uL — ABNORMAL LOW (ref 3.87–5.11)
RDW: 12.4 % (ref 11.5–15.5)
WBC: 12.3 10*3/uL — ABNORMAL HIGH (ref 4.0–10.5)

## 2016-04-15 LAB — TYPE AND SCREEN
ABO/RH(D): O POS
ANTIBODY SCREEN: NEGATIVE

## 2016-04-15 LAB — ABO/RH: ABO/RH(D): O POS

## 2016-04-15 MED ORDER — COCONUT OIL OIL: 1.0000 "application " | TOPICAL_OIL | Status: DC | PRN

## 2016-04-15 MED ORDER — PHENYLEPHRINE 40 MCG/ML (10ML) SYRINGE FOR IV PUSH (FOR BLOOD PRESSURE SUPPORT)
80.0000 ug | PREFILLED_SYRINGE | INTRAVENOUS | Status: DC | PRN
  Filled 2016-04-15: qty 5
  Filled 2016-04-15: qty 10

## 2016-04-15 MED ORDER — MEASLES, MUMPS & RUBELLA VAC ~~LOC~~ INJ
0.5000 mL | INJECTION | Freq: Once | SUBCUTANEOUS | Status: DC
  Filled 2016-04-15: qty 0.5

## 2016-04-15 MED ORDER — PENICILLIN G POTASSIUM 5000000 UNITS IJ SOLR
2.5000 10*6.[IU] | INTRAVENOUS | Status: DC
  Administered 2016-04-15 (×2): 2.5 10*6.[IU] via INTRAVENOUS
  Filled 2016-04-15 (×4): qty 2.5

## 2016-04-15 MED ORDER — OXYCODONE-ACETAMINOPHEN 5-325 MG PO TABS: 1.0000 | ORAL_TABLET | ORAL | Status: DC | PRN

## 2016-04-15 MED ORDER — ONDANSETRON HCL 4 MG/2ML IJ SOLN: 4.0000 mg | Freq: Four times a day (QID) | INTRAMUSCULAR | Status: DC | PRN

## 2016-04-15 MED ORDER — SIMETHICONE 80 MG PO CHEW: 80.0000 mg | CHEWABLE_TABLET | ORAL | Status: DC | PRN

## 2016-04-15 MED ORDER — FLEET ENEMA 7-19 GM/118ML RE ENEM: 1.0000 | ENEMA | RECTAL | Status: DC | PRN

## 2016-04-15 MED ORDER — DIPHENHYDRAMINE HCL 50 MG/ML IJ SOLN: 12.5000 mg | INTRAMUSCULAR | Status: DC | PRN

## 2016-04-15 MED ORDER — BUTORPHANOL TARTRATE 1 MG/ML IJ SOLN
1.0000 mg | INTRAMUSCULAR | Status: DC | PRN
  Administered 2016-04-15 (×2): 1 mg via INTRAVENOUS
  Filled 2016-04-15 (×2): qty 1

## 2016-04-15 MED ORDER — OXYTOCIN BOLUS FROM INFUSION
500.0000 mL | Freq: Once | INTRAVENOUS | Status: AC
  Administered 2016-04-15: 500 mL via INTRAVENOUS

## 2016-04-15 MED ORDER — BENZOCAINE-MENTHOL 20-0.5 % EX AERO
1.0000 "application " | INHALATION_SPRAY | CUTANEOUS | Status: DC | PRN
  Administered 2016-04-15 – 2016-04-17 (×2): 1 via TOPICAL
  Filled 2016-04-15 (×2): qty 56

## 2016-04-15 MED ORDER — WITCH HAZEL-GLYCERIN EX PADS: 1.0000 "application " | MEDICATED_PAD | CUTANEOUS | Status: DC | PRN

## 2016-04-15 MED ORDER — PENICILLIN G POTASSIUM 5000000 UNITS IJ SOLR
5.0000 10*6.[IU] | Freq: Once | INTRAVENOUS | Status: AC
  Administered 2016-04-15: 5 10*6.[IU] via INTRAVENOUS
  Filled 2016-04-15: qty 5

## 2016-04-15 MED ORDER — ONDANSETRON HCL 4 MG/2ML IJ SOLN: 4.0000 mg | INTRAMUSCULAR | Status: DC | PRN

## 2016-04-15 MED ORDER — SOD CITRATE-CITRIC ACID 500-334 MG/5ML PO SOLN: 30.0000 mL | ORAL | Status: DC | PRN

## 2016-04-15 MED ORDER — ACETAMINOPHEN 325 MG PO TABS: 650.0000 mg | ORAL_TABLET | ORAL | Status: DC | PRN

## 2016-04-15 MED ORDER — EPHEDRINE 5 MG/ML INJ
10.0000 mg | INTRAVENOUS | Status: DC | PRN
  Filled 2016-04-15: qty 4

## 2016-04-15 MED ORDER — LACTATED RINGERS IV SOLN: 500.0000 mL | INTRAVENOUS | Status: DC | PRN

## 2016-04-15 MED ORDER — FENTANYL 2.5 MCG/ML BUPIVACAINE 1/10 % EPIDURAL INFUSION (WH - ANES)
14.0000 mL/h | INTRAMUSCULAR | Status: DC | PRN
  Administered 2016-04-15: 14 mL/h via EPIDURAL
  Filled 2016-04-15: qty 125

## 2016-04-15 MED ORDER — LIDOCAINE HCL (PF) 1 % IJ SOLN
INTRAMUSCULAR | Status: DC | PRN
  Administered 2016-04-15: 8 mL via EPIDURAL
  Administered 2016-04-15: 5 mL via EPIDURAL

## 2016-04-15 MED ORDER — PHENYLEPHRINE 40 MCG/ML (10ML) SYRINGE FOR IV PUSH (FOR BLOOD PRESSURE SUPPORT)
80.0000 ug | PREFILLED_SYRINGE | INTRAVENOUS | Status: DC | PRN
  Filled 2016-04-15: qty 5

## 2016-04-15 MED ORDER — OXYTOCIN 40 UNITS IN LACTATED RINGERS INFUSION - SIMPLE MED
1.0000 m[IU]/min | INTRAVENOUS | Status: DC
  Administered 2016-04-15: 2 m[IU]/min via INTRAVENOUS

## 2016-04-15 MED ORDER — LIDOCAINE HCL (PF) 1 % IJ SOLN
30.0000 mL | INTRAMUSCULAR | Status: DC | PRN
  Filled 2016-04-15: qty 30

## 2016-04-15 MED ORDER — TETANUS-DIPHTH-ACELL PERTUSSIS 5-2.5-18.5 LF-MCG/0.5 IM SUSP: 0.5000 mL | Freq: Once | INTRAMUSCULAR | Status: DC

## 2016-04-15 MED ORDER — ZOLPIDEM TARTRATE 5 MG PO TABS: 5.0000 mg | ORAL_TABLET | Freq: Every evening | ORAL | Status: DC | PRN

## 2016-04-15 MED ORDER — TERBUTALINE SULFATE 1 MG/ML IJ SOLN
0.2500 mg | Freq: Once | INTRAMUSCULAR | Status: DC | PRN
  Filled 2016-04-15: qty 1

## 2016-04-15 MED ORDER — LACTATED RINGERS IV SOLN
INTRAVENOUS | Status: DC
  Administered 2016-04-15 (×2): via INTRAVENOUS

## 2016-04-15 MED ORDER — LACTATED RINGERS IV SOLN: 500.0000 mL | Freq: Once | INTRAVENOUS | Status: DC

## 2016-04-15 MED ORDER — OXYTOCIN 40 UNITS IN LACTATED RINGERS INFUSION - SIMPLE MED
2.5000 [IU]/h | INTRAVENOUS | Status: DC
  Filled 2016-04-15: qty 1000

## 2016-04-15 MED ORDER — IBUPROFEN 600 MG PO TABS
600.0000 mg | ORAL_TABLET | Freq: Four times a day (QID) | ORAL | Status: DC
  Administered 2016-04-15 – 2016-04-17 (×7): 600 mg via ORAL
  Filled 2016-04-15 (×7): qty 1

## 2016-04-15 MED ORDER — DIBUCAINE 1 % RE OINT: 1.0000 "application " | TOPICAL_OINTMENT | RECTAL | Status: DC | PRN

## 2016-04-15 MED ORDER — SENNOSIDES-DOCUSATE SODIUM 8.6-50 MG PO TABS
2.0000 | ORAL_TABLET | ORAL | Status: DC
  Administered 2016-04-16 (×2): 2 via ORAL
  Filled 2016-04-15 (×2): qty 2

## 2016-04-15 MED ORDER — OXYCODONE-ACETAMINOPHEN 5-325 MG PO TABS: 2.0000 | ORAL_TABLET | ORAL | Status: DC | PRN

## 2016-04-15 MED ORDER — ONDANSETRON HCL 4 MG PO TABS
4.0000 mg | ORAL_TABLET | ORAL | Status: DC | PRN
  Administered 2016-04-15: 4 mg via ORAL
  Filled 2016-04-15: qty 1

## 2016-04-15 NOTE — Anesthesia Preprocedure Evaluation (Signed)
Anesthesia Evaluation  Patient identified by MRN, date of birth, ID band Patient awake    Reviewed: Allergy & Precautions, H&P , Patient's Chart, lab work & pertinent test results  Airway Mallampati: II  TM Distance: >3 FB Neck ROM: full    Dental no notable dental hx.    Pulmonary neg pulmonary ROS,    Pulmonary exam normal breath sounds clear to auscultation       Cardiovascular negative cardio ROS Normal cardiovascular exam Rhythm:regular Rate:Normal     Neuro/Psych negative neurological ROS  negative psych ROS   GI/Hepatic negative GI ROS, Neg liver ROS,   Endo/Other  negative endocrine ROS  Renal/GU negative Renal ROS     Musculoskeletal   Abdominal Normal abdominal exam  (+)   Peds  Hematology negative hematology ROS (+)   Anesthesia Other Findings   Reproductive/Obstetrics (+) Pregnancy                             Anesthesia Physical Anesthesia Plan  ASA: II  Anesthesia Plan: Epidural   Post-op Pain Management:    Induction:   Airway Management Planned:   Additional Equipment:   Intra-op Plan:   Post-operative Plan:   Informed Consent: I have reviewed the patients History and Physical, chart, labs and discussed the procedure including the risks, benefits and alternatives for the proposed anesthesia with the patient or authorized representative who has indicated his/her understanding and acceptance.     Plan Discussed with:   Anesthesia Plan Comments:         Anesthesia Quick Evaluation

## 2016-04-15 NOTE — MAU Note (Signed)
Pt states large gush of fluid at 0400-clear. Some irregular contractions. +FM.

## 2016-04-15 NOTE — Anesthesia Pain Management Evaluation Note (Signed)
  CRNA Pain Management Visit Note  Patient: Carly [name], 30 y.o., female  "Hello I am a member of the anesthesia team at [facility] We have an anesthesia team available at all times to provide care throughout the hospital, including epidural management and anesthesia for C-section. I don't know your plan for the delivery whether it a natural birth, water birth, IV sedation, nitrous supplementation, doula or epidural, but we want to meet your pain goals."   1.Was your pain managed to your expectations on prior hospitalizations?    No prior hospitalizations   2.What is your expectation for pain management during this hospitalization?     Epidural  3.How can we help you reach that goal?   Record the patient's initial score and the patient's pain goal.   Pain: 3  Pain Goal: 7 [facility] wants you to be able to say your pain was always managed very well.  Cephus Shelling 04/15/2016

## 2016-04-15 NOTE — Anesthesia Procedure Notes (Signed)
Epidural Patient location during procedure: OB Start time: 04/15/2016 2:50 PM End time: 04/15/2016 2:54 PM  Staffing Anesthesiologist: Leilani Able Performed: anesthesiologist   Preanesthetic Checklist Completed: patient identified, surgical consent, pre-op evaluation, timeout performed, IV checked, risks and benefits discussed and monitors and equipment checked  Epidural Patient position: sitting Prep: site prepped and draped and DuraPrep Patient monitoring: continuous pulse ox and blood pressure Approach: midline Location: L3-L4 Injection technique: LOR air  Needle:  Needle type: Tuohy  Needle gauge: 17 G Needle length: 9 cm and 9 Needle insertion depth: 7 cm Catheter type: closed end flexible Catheter size: 19 Gauge Catheter at skin depth: 12 cm Test dose: negative and Other  Assessment Sensory level: T9 Events: blood not aspirated, injection not painful, no injection resistance, negative IV test and no paresthesia  Additional Notes Reason for block:procedure for pain

## 2016-04-15 NOTE — H&P (Signed)
Pt is a 30 y/o black female at term who presented to the ER with SROM. She had an uncomplicated PNC. She had + pool/+fern . She was 3 cm on admission. She is GBS+ PMHX: See [place] PE: VSSAF        HEENT-wnl        ABD-gravid, non tender        FHTs-reactive IMP/ IUP at term with SROM.            +GBS Plan/ Admit

## 2016-04-15 NOTE — Progress Notes (Signed)
Notified of pt arrival in MAU and SROM at 0400 and exam. Will admit to labor and delivery with orders for epidural and stadol and penicillin.

## 2016-04-16 LAB — CBC
HCT: 26.4 % — ABNORMAL LOW (ref 36.0–46.0)
Hemoglobin: 9.1 g/dL — ABNORMAL LOW (ref 12.0–15.0)
MCH: 29.9 pg (ref 26.0–34.0)
MCHC: 34.5 g/dL (ref 30.0–36.0)
MCV: 86.8 fL (ref 78.0–100.0)
PLATELETS: 165 10*3/uL (ref 150–400)
RBC: 3.04 MIL/uL — ABNORMAL LOW (ref 3.87–5.11)
RDW: 12.5 % (ref 11.5–15.5)
WBC: 16.6 10*3/uL — AB (ref 4.0–10.5)

## 2016-04-16 LAB — RPR: RPR: NONREACTIVE

## 2016-04-16 NOTE — Lactation Note (Addendum)
This note was copied from a baby's chart. Lactation Consultation Note New mom BF when entered rm. In cradle position. Mom stated she didn't think she had any milk, the baby wasn't satisfied pulling off and on. Assisted in football position. Short shaft nipple, compressible w/sandwhich. Shells given to wear in bra between feedings. Mom encouraged to feed baby 8-12 times/24 hours and with feeding cues. Referred to Baby and Me Book in Breastfeeding section Pg. 22-23 for position options and Proper latch demonstration. New born behavior discussed, encouraged STS and I&O.  Hand expression taught w/no colostrum noted at this time. Hand pump given for stimulation. WH/LC brochure given w/resources, support groups and LC services.  Patient Name: Carly [name] [id number] Date: 04/16/2016 Reason for consult: Initial assessment   Maternal Data Has patient been taught Hand Expression?: Yes Does the patient have breastfeeding experience prior to this delivery?: No  Feeding Feeding Type: Breast Fed Length of feed: 15 min  LATCH Score/Interventions Latch: Repeated attempts needed to sustain latch, nipple held in mouth throughout feeding, stimulation needed to elicit sucking reflex. Intervention(s): Adjust position;Assist with latch;Breast massage;Breast compression  Audible Swallowing: None Intervention(s): Skin to skin;Hand expression  Type of Nipple: Everted at rest and after stimulation  Comfort (Breast/Nipple): Soft / non-tender     Hold (Positioning): Assistance needed to correctly position infant at breast and maintain latch. Intervention(s): Breastfeeding basics reviewed;Support Pillows;Position options;Skin to skin  LATCH Score: 6  Lactation Tools Discussed/Used Tools: Pump;Shells Shell Type: Inverted Breast pump type: Manual WIC Program: No Pump Review: Milk Storage;Setup, frequency, and cleaning Initiated by:: Peri Jefferson RN IBCLC Date initiated:: 04/16/16   Consult Status Consult  Status: Follow-up Date: 04/17/16 Follow-up type: In-patient    Montrell Cessna, Diamond Nickel 04/16/2016, 3:32 AM

## 2016-04-16 NOTE — Progress Notes (Signed)
Post Partum Day 1 Subjective: no complaints, up ad lib, voiding, tolerating PO, + flatus and breast feeding  Objective: Blood pressure 99/62, pulse 89, temperature 97.9 F (36.6 C), temperature source Oral, resp. rate 18, height 5\' 5"  (1.651 m), weight 78.9 kg (174 lb), last menstrual period 07/23/2015, SpO2 96 %, unknown if currently breastfeeding.  Physical Exam:  General: alert, cooperative and no distress Lochia: appropriate Uterine Fundus: firm perineum: healing well, no significant drainage DVT Evaluation: No evidence of DVT seen on physical exam. Negative Homan's sign. No cords or calf tenderness.   Recent Labs  04/15/16 0630 04/16/16 0504  HGB 11.2* 9.1*  HCT 32.2* 26.4*    Assessment/Plan: Plan for discharge tomorrow, Breastfeeding and Circumcision prior to discharge   LOS: 1 day   Tambra Muller STACIA 04/16/2016, 9:25 AM

## 2016-04-16 NOTE — Anesthesia Postprocedure Evaluation (Signed)
Anesthesia Post Note  Patient: Carly [name]  Procedure(s) Performed: * No procedures listed *  Patient location during evaluation: Mother Baby Anesthesia Type: Epidural Level of consciousness: awake Pain management: satisfactory to patient Vital Signs Assessment: post-procedure vital signs reviewed and stable Respiratory status: spontaneous breathing Cardiovascular status: stable Anesthetic complications: no     Last Vitals:  Vitals:   04/16/16 0055 04/16/16 0542  BP: (!) 114/57 99/62  Pulse: 93 89  Resp: 18 18  Temp: 37.1 C 36.6 C    Last Pain:  Vitals:   04/16/16 0720  TempSrc:   PainSc: 3    Pain Goal: Patients Stated Pain Goal: 2 (04/16/16 0720)               Cephus Shelling

## 2016-04-17 MED ORDER — IBUPROFEN 600 MG PO TABS: 600.0000 mg | ORAL_TABLET | Freq: Four times a day (QID) | ORAL | 0 refills | Status: DC

## 2016-04-17 NOTE — Discharge Summary (Addendum)
Obstetric Discharge Summary Reason for Admission: rupture of membranes Prenatal Procedures: none Intrapartum Procedures: spontaneous vaginal delivery Postpartum Procedures: none Complications-Operative and Postpartum: 2 degree perineal laceration Hemoglobin  Date Value Ref Range Status  04/16/2016 9.1 (L) 12.0 - 15.0 g/dL Final   HCT  Date Value Ref Range Status  04/16/2016 26.4 (L) 36.0 - 46.0 % Final    Physical Exam:  General: alert, cooperative and appears stated age 30: appropriate Uterine Fundus: firm DVT Evaluation: No evidence of DVT seen on physical exam.  Discharge Diagnoses: Term Pregnancy-delivered  Discharge Information: Date: 04/17/2016 Activity: pelvic rest Diet: routine Medications: PNV and Ibuprofen, declined oxycodone Condition: stable Instructions: refer to practice specific booklet Discharge to: home Follow-up Information    Levi Aland, MD Follow up in 4 week(s).   Specialty:  Obstetrics and Gynecology Contact information: [address] [place] [address] [phone]           Newborn Data: Live born female  Birth Weight: 6 lb 4.5 oz (2849 g) APGAR: 9, 9  Home with mother.  Kanton Kamel GEFFEL Pauline Trainer 04/17/2016, 8:22 AM

## 2016-04-17 NOTE — Lactation Note (Signed)
This note was copied from a baby's chart. Lactation Consultation Note  Patient Name: Boy Taila Mcalhany [id number] Date: 04/17/2016 Reason for consult: Follow-up assessment;Difficult latch Follow up visit made to assist mom with latching baby to right breast.  Mom states baby does well on left but has never latched to right.  Baby crying and showing hunger cues.  Positioned baby in football hold on right.  Mom shown how to compress her breast tissue for easier latch.  Colostrum easily expressed.  Baby latched easily and deep.  Observed baby for 10 minutes of active sucking/swallows.  Reviewed basics with mom and encouraged to call for concerns/assist prn.  Maternal Data    Feeding Feeding Type: Breast Fed Length of feed: 15 min  LATCH Score/Interventions Latch: Grasps breast easily, tongue down, lips flanged, rhythmical sucking. Intervention(s): Adjust position;Assist with latch;Breast massage;Breast compression  Audible Swallowing: Spontaneous and intermittent Intervention(s): Hand expression;Skin to skin  Type of Nipple: Everted at rest and after stimulation  Comfort (Breast/Nipple): Soft / non-tender     Hold (Positioning): Assistance needed to correctly position infant at breast and maintain latch. Intervention(s): Breastfeeding basics reviewed;Support Pillows;Position options;Skin to skin  LATCH Score: 9  Lactation Tools Discussed/Used     Consult Status Consult Status: Complete    Marilynn Ekstein S 04/17/2016, 11:13 AM

## 2016-04-17 NOTE — Progress Notes (Signed)
Patient is doing well.  She is ambulating, voiding, tolerating PO.  Pain control is good.  Lochia is appropriate  Vitals:   04/16/16 0055 04/16/16 0542 04/16/16 1805 04/17/16 0619  BP: (!) 114/57 99/62 117/78 111/70  Pulse: 93 89 72 89  Resp: 18 18 18 18   Temp: 98.7 F (37.1 C) 97.9 F (36.6 C) 99.1 F (37.3 C) 98 F (36.7 C)  TempSrc: Oral Oral Oral Axillary  SpO2:      Weight:      Height:        NAD Fundus firm Ext: trace edema  Lab Results  Component Value Date   WBC 16.6 (H) 04/16/2016   HGB 9.1 (L) 04/16/2016   HCT 26.4 (L) 04/16/2016   MCV 86.8 04/16/2016   PLT 165 04/16/2016    --/--/O POS, O POS (08/02 0630)/RImmune  A/P 30 y.o. G2P1011 PPD#2 s/p TSVD. Routine care.   Meeting all goals--d/c today.    [facility] GEFFEL [employer]

## 2016-05-19 ENCOUNTER — Other Ambulatory Visit: Payer: Self-pay | Admitting: Obstetrics and Gynecology

## 2016-05-20 LAB — CYTOLOGY - PAP

## 2016-05-28 ENCOUNTER — Other Ambulatory Visit: Payer: Self-pay | Admitting: Obstetrics and Gynecology

## 2016-12-02 ENCOUNTER — Other Ambulatory Visit: Payer: Self-pay | Admitting: Obstetrics and Gynecology

## 2016-12-04 LAB — CYTOLOGY - PAP

## 2017-04-09 IMAGING — US US OB TRANSVAGINAL
1 series · 15 of 28 positions shown · non-contrast
Comparison: None.

CLINICAL DATA: Pregnant patient with vaginal bleeding and cramping.
intrauterine pregnancy 05/30/2015, 1 day prior.

EXAM:
OBSTETRIC <14 WK US AND TRANSVAGINAL OB US
TECHNIQUE: Both transabdominal and transvaginal ultrasound examinations were
performed for complete evaluation of the gestation as well as the
maternal uterus, adnexal regions, and pelvic cul-de-sac.
Transvaginal technique was performed to assess early pregnancy.

[Series 1: us ob transvaginal · 15 of 55 slices shown]
[im 1/55]
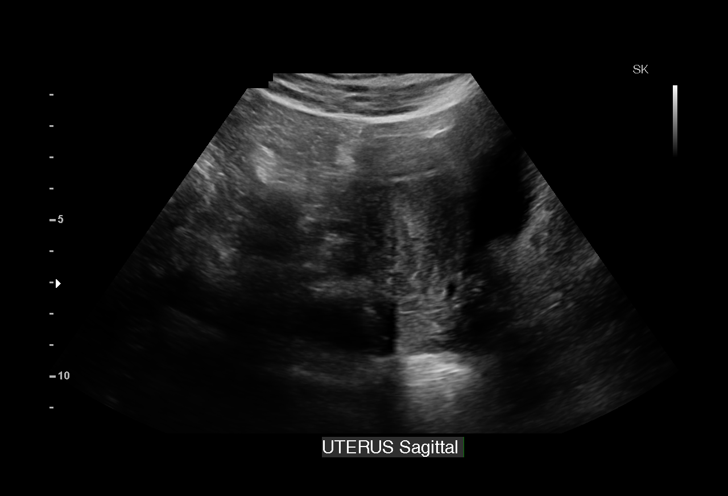
[im 5/55]
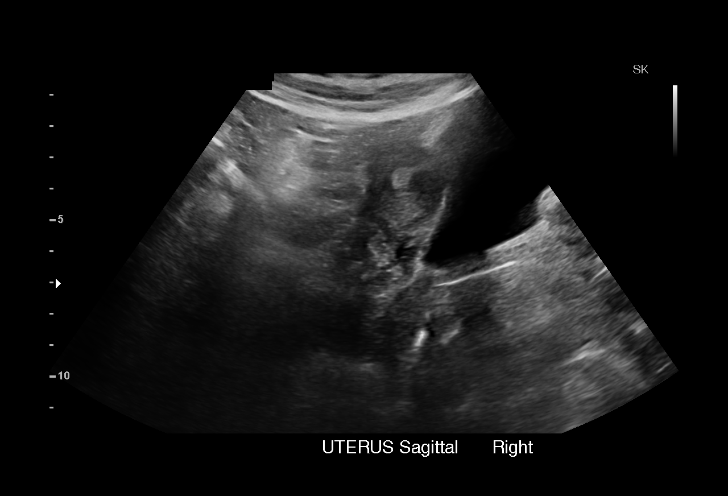
[im 9/55]
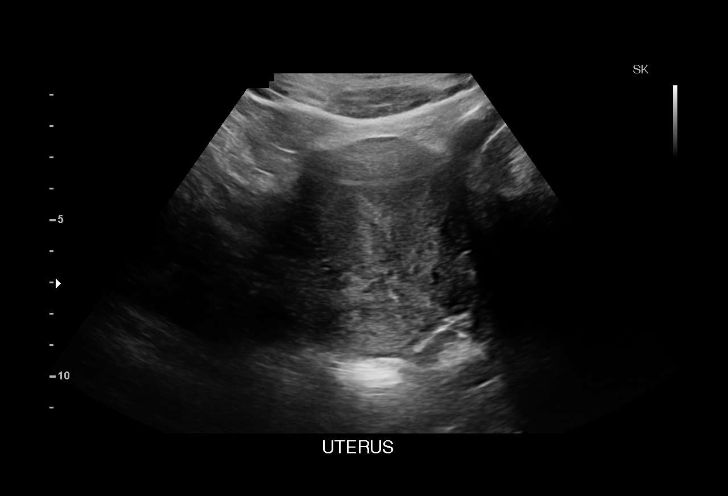
[im 13/55]
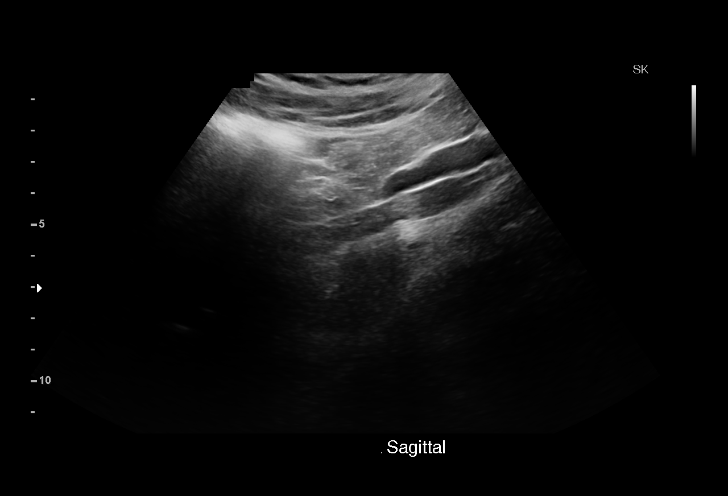
[im 17/55]
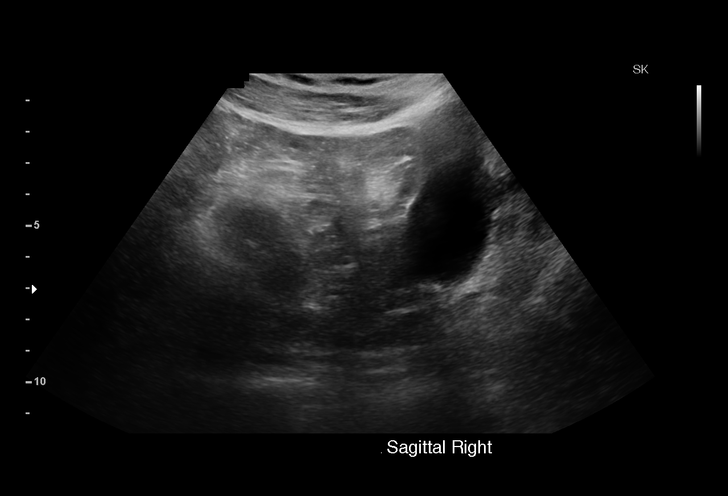
[im 21/55]
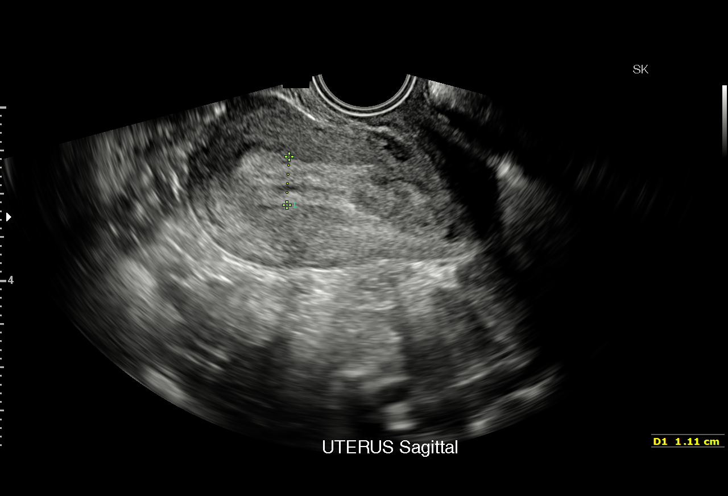
[im 25/55]
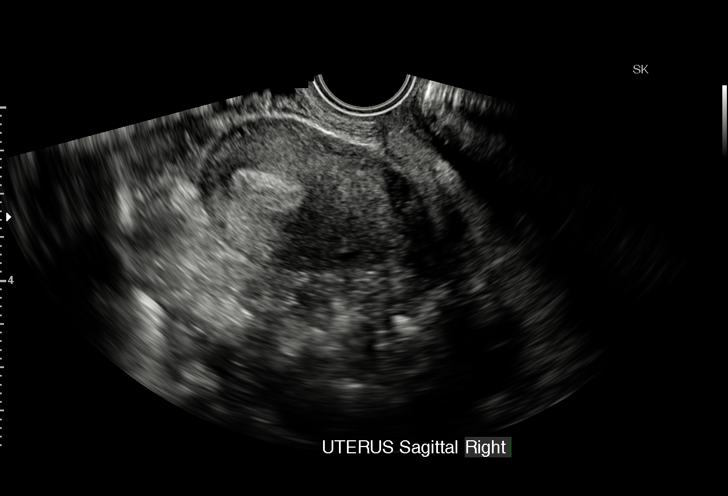
[im 29/55]
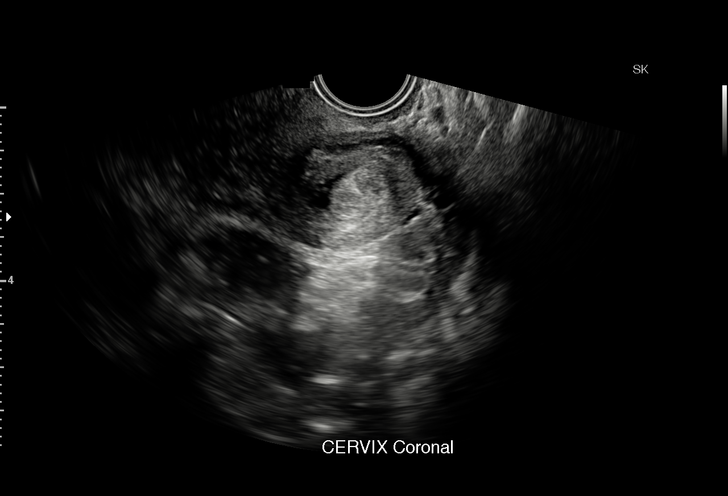
[im 31/55]
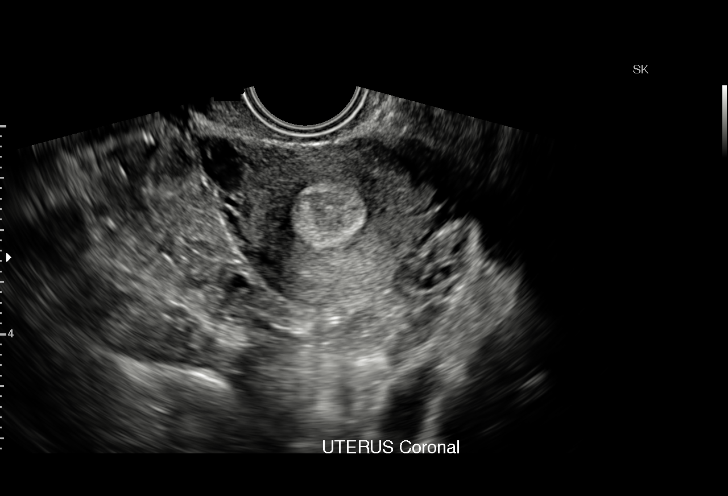
[im 35/55]
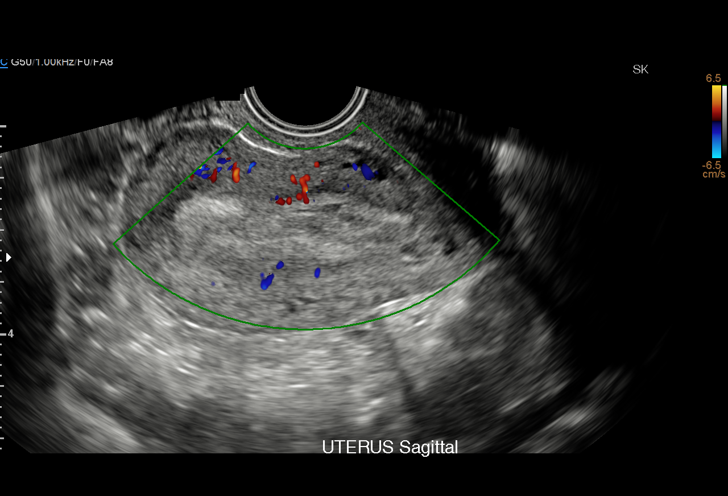
[im 39/55]
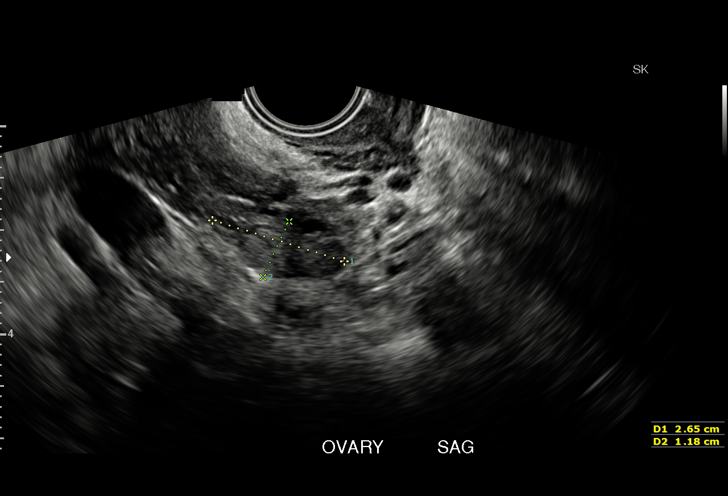
[im 43/55]
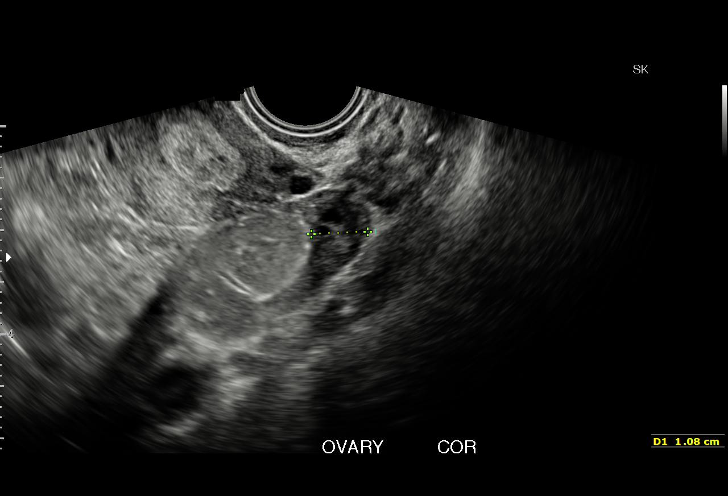
[im 47/55]
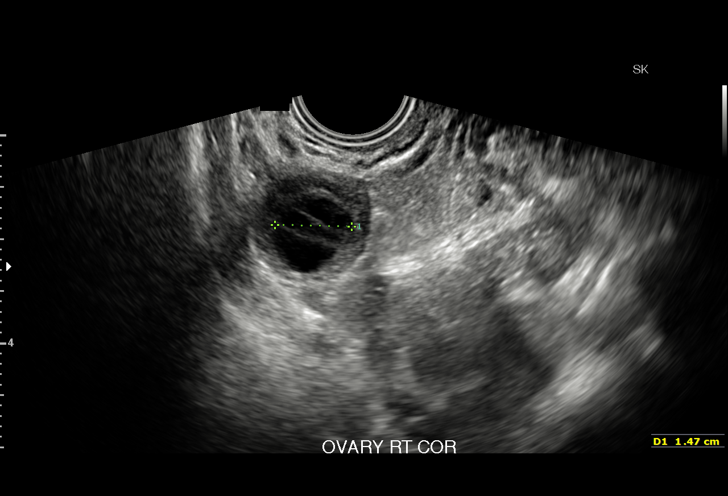
[im 51/55]
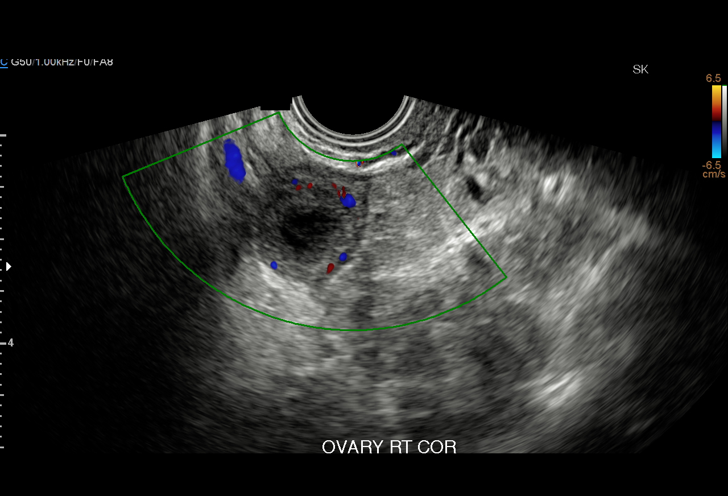
[im 55/55]
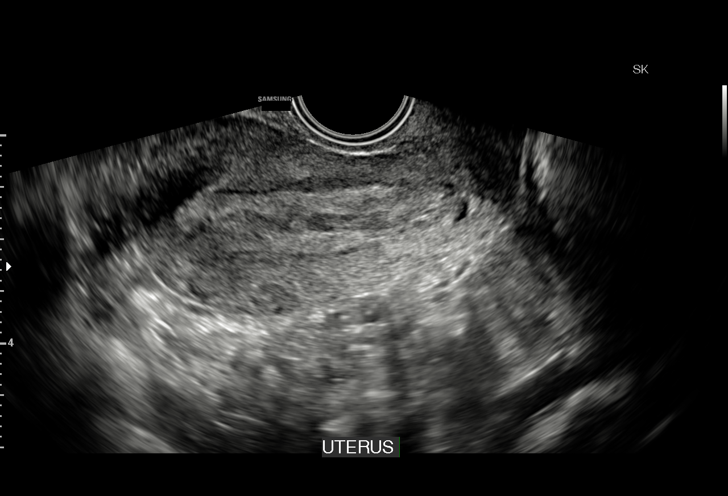

[15 of 28 positions shown; findings below may reference images not displayed]

FINDINGS: Intrauterine gestational sac: Not present.

Yolk sac:  Not present.

Embryo:  Not present.

Maternal uterus/adnexae: The endometrium measures 11 mm in
thickness. No significant fluid in the endometrial canal. No
abnormal endometrial blood flow. The left ovary measures 2.7 x 1.2 x
1.1 cm and appears normal. The right ovary measures 2.6 x 2.2 x
cm and contains an involuting corpus luteal cyst. There is no free
fluid in the pelvis.
IMPRESSION: 1. No intrauterine pregnancy. Thickened endometrium, without blood
flow to suggest retained products. Given history of recent
ultrasound demonstrating intrauterine pregnancy, findings are
consistent with failed pregnancy.
2. Corpus luteal cyst in the left ovary.

## 2017-04-19 LAB — OB RESULTS CONSOLE GC/CHLAMYDIA
CHLAMYDIA, DNA PROBE: NEGATIVE
GC PROBE AMP, GENITAL: NEGATIVE

## 2017-04-19 LAB — OB RESULTS CONSOLE HIV ANTIBODY (ROUTINE TESTING): HIV: NONREACTIVE

## 2017-04-19 LAB — OB RESULTS CONSOLE RUBELLA ANTIBODY, IGM: RUBELLA: IMMUNE

## 2017-04-19 LAB — OB RESULTS CONSOLE VARICELLA ZOSTER ANTIBODY, IGG: Varicella: NON-IMMUNE/NOT IMMUNE

## 2017-04-19 LAB — OB RESULTS CONSOLE RPR: RPR: NONREACTIVE

## 2017-04-19 LAB — OB RESULTS CONSOLE HEPATITIS B SURFACE ANTIGEN: Hepatitis B Surface Ag: NEGATIVE

## 2017-08-13 ENCOUNTER — Encounter (HOSPITAL_COMMUNITY): Payer: Self-pay | Admitting: *Deleted

## 2017-08-13 ENCOUNTER — Inpatient Hospital Stay (HOSPITAL_COMMUNITY)
Admission: AD | Admit: 2017-08-13 | Discharge: 2017-08-13 | Disposition: A | Discharge status: Home / Self Care | Payer: 59 | Source: Ambulatory Visit | Attending: Obstetrics and Gynecology | Admitting: Obstetrics and Gynecology

## 2017-08-13 ENCOUNTER — Other Ambulatory Visit: Payer: Self-pay

## 2017-08-13 DIAGNOSIS — O4703 False labor before 37 completed weeks of gestation, third trimester: Secondary | ICD-10-CM

## 2017-08-13 DIAGNOSIS — Z79899 Other long term (current) drug therapy: Secondary | ICD-10-CM | POA: Diagnosis not present

## 2017-08-13 DIAGNOSIS — Z3A33 33 weeks gestation of pregnancy: Secondary | ICD-10-CM | POA: Insufficient documentation

## 2017-08-13 LAB — URINALYSIS, ROUTINE W REFLEX MICROSCOPIC
Bilirubin Urine: NEGATIVE
Glucose, UA: NEGATIVE mg/dL
Ketones, ur: NEGATIVE mg/dL
Leukocytes, UA: NEGATIVE
Nitrite: NEGATIVE
PROTEIN: NEGATIVE mg/dL
SPECIFIC GRAVITY, URINE: 1.011 (ref 1.005–1.030)
pH: 7 (ref 5.0–8.0)

## 2017-08-13 MED ORDER — BETAMETHASONE SOD PHOS & ACET 6 (3-3) MG/ML IJ SUSP
12.0000 mg | Freq: Once | INTRAMUSCULAR | Status: AC
  Administered 2017-08-13: 12 mg via INTRAMUSCULAR
  Filled 2017-08-13: qty 2

## 2017-08-13 NOTE — Discharge Instructions (Signed)
Return tomorrow for second betamethasone shot.    Preterm Labor and Birth Information Pregnancy normally lasts 39-41 weeks. Preterm labor is when labor starts early. It starts before you have been pregnant for 37 whole weeks. What are the risk factors for preterm labor? Preterm labor is more likely to occur in women who:  Have an infection while pregnant.  Have a cervix that is short.  Have gone into preterm labor before.  Have had surgery on their cervix.  Are younger than age 33.  Are older than age 33.  Are African American.  Are pregnant with two or more babies.  Take street drugs while pregnant.  Smoke while pregnant.  Do not gain enough weight while pregnant.  Got pregnant right after another pregnancy.  What are the symptoms of preterm labor? Symptoms of preterm labor include:  Cramps. The cramps may feel like the cramps some women get during their period. The cramps may happen with watery poop (diarrhea).  Pain in the belly (abdomen).  Pain in the lower back.  Regular contractions or tightening. It may feel like your belly is getting tighter.  Pressure in the lower belly that seems to get stronger.  More fluid (discharge) leaking from the vagina. The fluid may be watery or bloody.  Water breaking.  Why is it important to notice signs of preterm labor? Babies who are born early may not be fully developed. They have a higher chance for:  Long-term heart problems.  Long-term lung problems.  Trouble controlling body systems, like breathing.  Bleeding in the brain.  A condition called cerebral palsy.  Learning difficulties.  Death.  These risks are highest for babies who are born before 34 weeks of pregnancy. How is preterm labor treated? Treatment depends on:  How long you were pregnant.  Your condition.  The health of your baby.  Treatment may involve:  Having a stitch (suture) placed in your cervix. When you give birth, your cervix  opens so the baby can come out. The stitch keeps the cervix from opening too soon.  Staying at the hospital.  Taking or getting medicines, such as: ? Hormone medicines. ? Medicines to stop contractions. ? Medicines to help the babys lungs develop. ? Medicines to prevent your baby from having cerebral palsy.  What should I do if I am in preterm labor? If you think you are going into labor too soon, call your doctor right away. How can I prevent preterm labor?  Do not use any tobacco products. ? Examples of these are cigarettes, chewing tobacco, and e-cigarettes. ? If you need help quitting, ask your doctor.  Do not use street drugs.  Do not use any medicines unless you ask your doctor if they are safe for you.  Talk with your doctor before taking any herbal supplements.  Make sure you gain enough weight.  Watch for infection. If you think you might have an infection, get it checked right away.  If you have gone into preterm labor before, tell your doctor. This information is not intended to replace advice given to you by your health care provider. Make sure you discuss any questions you have with your health care provider. Document Released: 11/27/2008 Document Revised: 02/11/2016 Document Reviewed: 01/22/2016 Elsevier Interactive Patient Education  2018 [employer]

## 2017-08-13 NOTE — MAU Note (Signed)
Pt presents stating was called by MD's office and instructed to go MAU for BMZ injection.  Pt states she was in the office today and vaginal swab obtained to determine if she would possibly deliver in 2 weeks.  Pt states informed test was positive and requires steroids.  Pt also states had VE today & cervix 2cms.  Pt also states she was told be evaluated for LOF per MD office.

## 2017-08-13 NOTE — MAU Provider Note (Signed)
  History     CSN: [id number]  Arrival date and time: 08/13/17 1725   Seen by provider at 1820    Chief Complaint  Patient presents with  . Rupture of Membranes  . BMZ injection   HPI Carly [name] 31 y.o. 2565w0d  Comes to MAU as clietn was called at work and told her FFN was positive and to go to MAU for betamethasone injection.  No contractions today.  No leaking of fluid reported by client.  Has reported loss of mucous plug but does not think she is leaking amniotic fluid.  No vaginal bleeding.  OB History    Gravida Para Term Preterm AB Living   3 1 1   1 1    SAB TAB Ectopic Multiple Live Births   1     0 1      Past Medical History:  Diagnosis Date  . Medical history non-contributory     Past Surgical History:  Procedure Laterality Date  . NO PAST SURGERIES      Family History  Problem Relation Age of Onset  . Cancer Maternal Grandfather   . Heart disease Paternal Grandfather     Social History   Tobacco Use  . Smoking status: Never Smoker  . Smokeless tobacco: Never Used  Substance Use Topics  . Alcohol use: No    Alcohol/week: 0.0 oz  . Drug use: No    Allergies: No Known Allergies  Medications Prior to Admission  Medication Sig Dispense Refill Last Dose  . ibuprofen (ADVIL,MOTRIN) 600 MG tablet Take 1 tablet (600 mg total) by mouth every 6 (six) hours. 40 tablet 0   . Prenatal Vit-Fe Fumarate-FA (PRENATAL MULTIVITAMIN) TABS tablet Take 1 tablet by mouth at bedtime.    Past Week at Unknown time    Review of Systems  Constitutional: Negative for fever.  Gastrointestinal: Negative for abdominal pain.  Genitourinary: Negative for vaginal bleeding and vaginal discharge.       Noticed that she has passed thick mucous consistent with loss of mucous plug.  No report of leaking of amniotic fluid   Physical Exam   Blood pressure (!) 114/93, pulse 86, temperature 98.4 F (36.9 C), resp. rate 18, height 5\' 5"  (1.651 m), weight 165 lb 12 oz (75.2 kg),  SpO2 100 %, unknown if currently breastfeeding.  Physical Exam  Nursing note and vitals reviewed. Constitutional: She is oriented to person, place, and time. She appears well-developed and well-nourished.  HENT:  Head: Normocephalic.  Eyes: EOM are normal.  Neck: Neck supple.  GI: Soft. There is no tenderness. There is no rebound and no guarding.  FHT monitor applied - no contractions, baseline is 140 and has moderate variability, no decelerations, 15x15 accels noted - reactive strip   Musculoskeletal: Normal range of motion.  Neurological: She is alert and oriented to person, place, and time.  Skin: Skin is warm and dry.  Psychiatric: She has a normal mood and affect.    MAU Course  Procedures  MDM Consulted with Dr. Henderson CloudHorvath.  Will give betamethasone today and tomorrow  Assessment and Plan  Threatened preterm labor Cervical dilation to 2 cm at the office No leaking of fluid  Plan Betamethasone today and tomorrow - ordered Return if you develop leaking of fluid, vaginal bleeding, regular contractions, or any abdominal or back pain.  Nikesh Teschner L Laura-Lee Villegas 08/13/2017, 6:26 PM

## 2017-08-14 ENCOUNTER — Inpatient Hospital Stay (HOSPITAL_COMMUNITY)
Admission: AD | Admit: 2017-08-14 | Discharge: 2017-08-14 | Disposition: A | Discharge status: Home / Self Care | Payer: 59 | Source: Ambulatory Visit | Attending: Obstetrics and Gynecology | Admitting: Obstetrics and Gynecology

## 2017-08-14 ENCOUNTER — Other Ambulatory Visit: Payer: Self-pay

## 2017-08-14 DIAGNOSIS — Z3A33 33 weeks gestation of pregnancy: Secondary | ICD-10-CM | POA: Diagnosis not present

## 2017-08-14 MED ORDER — BETAMETHASONE SOD PHOS & ACET 6 (3-3) MG/ML IJ SUSP
12.0000 mg | Freq: Once | INTRAMUSCULAR | Status: AC
  Administered 2017-08-14: 12 mg via INTRAMUSCULAR
  Filled 2017-08-14: qty 2

## 2017-08-14 NOTE — MAU Note (Signed)
Pt presents to receive 2nd dose of BMZ.  Voices no other c/o.

## 2017-08-14 NOTE — Progress Notes (Signed)
Pt discharged home ambulatory in stable condition after receiving 2nd dose of BMZ.  No rxns noted.

## 2017-08-30 LAB — OB RESULTS CONSOLE GBS: STREP GROUP B AG: NEGATIVE

## 2017-09-14 NOTE — L&D Delivery Note (Signed)
Patient was C/C/0 and pushed for less than 10 minutes with epidural.    NSVD female infant, Apgars 9/9, weight pending.   The patient had a second degree laceration repaired with 2-0 vicryl, and periurethral not requiring suture.. Fundus was firm. EBL was expected amount. Placenta was delivered intact. Vagina was clear.  Delayed cord clamping done for 30-60 minutes while warming baby. Baby was vigorous and doing skin to skin with mother.  Philip AspenALLAHAN, Sherlon Nied

## 2017-09-18 ENCOUNTER — Inpatient Hospital Stay (HOSPITAL_COMMUNITY): Payer: 59 | Admitting: Anesthesiology

## 2017-09-18 ENCOUNTER — Encounter (HOSPITAL_COMMUNITY): Payer: Self-pay

## 2017-09-18 ENCOUNTER — Inpatient Hospital Stay (HOSPITAL_COMMUNITY)
Admission: AD | Admit: 2017-09-18 | Discharge: 2017-09-20 | DRG: 807 | Disposition: A | Discharge status: Home / Self Care | Payer: 59 | Source: Ambulatory Visit | Attending: Obstetrics and Gynecology | Admitting: Obstetrics and Gynecology

## 2017-09-18 DIAGNOSIS — Z3A38 38 weeks gestation of pregnancy: Secondary | ICD-10-CM | POA: Diagnosis not present

## 2017-09-18 DIAGNOSIS — O9081 Anemia of the puerperium: Principal | ICD-10-CM | POA: Diagnosis not present

## 2017-09-18 DIAGNOSIS — Z3483 Encounter for supervision of other normal pregnancy, third trimester: Secondary | ICD-10-CM | POA: Diagnosis present

## 2017-09-18 LAB — CBC
HEMATOCRIT: 37 % (ref 36.0–46.0)
Hemoglobin: 12.6 g/dL (ref 12.0–15.0)
MCH: 28.8 pg (ref 26.0–34.0)
MCHC: 34.1 g/dL (ref 30.0–36.0)
MCV: 84.7 fL (ref 78.0–100.0)
Platelets: 219 10*3/uL (ref 150–400)
RBC: 4.37 MIL/uL (ref 3.87–5.11)
RDW: 12.7 % (ref 11.5–15.5)
WBC: 16.7 10*3/uL — AB (ref 4.0–10.5)

## 2017-09-18 LAB — TYPE AND SCREEN
ABO/RH(D): O POS
ANTIBODY SCREEN: NEGATIVE

## 2017-09-18 MED ORDER — SIMETHICONE 80 MG PO CHEW: 80.0000 mg | CHEWABLE_TABLET | ORAL | Status: DC | PRN

## 2017-09-18 MED ORDER — OXYTOCIN BOLUS FROM INFUSION
500.0000 mL | Freq: Once | INTRAVENOUS | Status: AC
  Administered 2017-09-18: 500 mL via INTRAVENOUS

## 2017-09-18 MED ORDER — SOD CITRATE-CITRIC ACID 500-334 MG/5ML PO SOLN: 30.0000 mL | ORAL | Status: DC | PRN

## 2017-09-18 MED ORDER — COCONUT OIL OIL
1.0000 "application " | TOPICAL_OIL | Status: DC | PRN
  Administered 2017-09-19: 1 via TOPICAL
  Filled 2017-09-18: qty 120

## 2017-09-18 MED ORDER — FENTANYL 2.5 MCG/ML BUPIVACAINE 1/10 % EPIDURAL INFUSION (WH - ANES)
14.0000 mL/h | INTRAMUSCULAR | Status: DC | PRN
  Administered 2017-09-18: 14 mL/h via EPIDURAL
  Filled 2017-09-18: qty 100

## 2017-09-18 MED ORDER — OXYTOCIN 40 UNITS IN LACTATED RINGERS INFUSION - SIMPLE MED
2.5000 [IU]/h | INTRAVENOUS | Status: DC
  Filled 2017-09-18: qty 1000

## 2017-09-18 MED ORDER — EPHEDRINE 5 MG/ML INJ
10.0000 mg | INTRAVENOUS | Status: DC | PRN
  Filled 2017-09-18: qty 2

## 2017-09-18 MED ORDER — PRENATAL MULTIVITAMIN CH
1.0000 | ORAL_TABLET | Freq: Every day | ORAL | Status: DC
  Administered 2017-09-19: 1 via ORAL
  Filled 2017-09-18: qty 1

## 2017-09-18 MED ORDER — EPHEDRINE 5 MG/ML INJ: 10.0000 mg | INTRAVENOUS | Status: DC | PRN

## 2017-09-18 MED ORDER — PHENYLEPHRINE 40 MCG/ML (10ML) SYRINGE FOR IV PUSH (FOR BLOOD PRESSURE SUPPORT)
80.0000 ug | PREFILLED_SYRINGE | INTRAVENOUS | Status: DC | PRN
  Filled 2017-09-18: qty 10
  Filled 2017-09-18: qty 5

## 2017-09-18 MED ORDER — SENNOSIDES-DOCUSATE SODIUM 8.6-50 MG PO TABS
2.0000 | ORAL_TABLET | ORAL | Status: DC
  Administered 2017-09-20: 2 via ORAL
  Filled 2017-09-18 (×2): qty 2

## 2017-09-18 MED ORDER — DIPHENHYDRAMINE HCL 50 MG/ML IJ SOLN: 12.5000 mg | INTRAMUSCULAR | Status: DC | PRN

## 2017-09-18 MED ORDER — WITCH HAZEL-GLYCERIN EX PADS: 1.0000 "application " | MEDICATED_PAD | CUTANEOUS | Status: DC | PRN

## 2017-09-18 MED ORDER — IBUPROFEN 600 MG PO TABS
600.0000 mg | ORAL_TABLET | Freq: Four times a day (QID) | ORAL | Status: DC
  Administered 2017-09-18 – 2017-09-20 (×7): 600 mg via ORAL
  Filled 2017-09-18 (×7): qty 1

## 2017-09-18 MED ORDER — DOCUSATE SODIUM 100 MG PO CAPS
100.0000 mg | ORAL_CAPSULE | Freq: Two times a day (BID) | ORAL | Status: DC
  Administered 2017-09-19 (×3): 100 mg via ORAL
  Filled 2017-09-18 (×3): qty 1

## 2017-09-18 MED ORDER — DIBUCAINE 1 % RE OINT: 1.0000 "application " | TOPICAL_OINTMENT | RECTAL | Status: DC | PRN

## 2017-09-18 MED ORDER — ZOLPIDEM TARTRATE 5 MG PO TABS: 5.0000 mg | ORAL_TABLET | Freq: Every evening | ORAL | Status: DC | PRN

## 2017-09-18 MED ORDER — TETANUS-DIPHTH-ACELL PERTUSSIS 5-2.5-18.5 LF-MCG/0.5 IM SUSP: 0.5000 mL | Freq: Once | INTRAMUSCULAR | Status: DC

## 2017-09-18 MED ORDER — LACTATED RINGERS IV SOLN
INTRAVENOUS | Status: DC
  Administered 2017-09-18: 125 mL/h via INTRAVENOUS

## 2017-09-18 MED ORDER — OXYCODONE-ACETAMINOPHEN 5-325 MG PO TABS: 1.0000 | ORAL_TABLET | ORAL | Status: DC | PRN

## 2017-09-18 MED ORDER — PHENYLEPHRINE 40 MCG/ML (10ML) SYRINGE FOR IV PUSH (FOR BLOOD PRESSURE SUPPORT)
80.0000 ug | PREFILLED_SYRINGE | INTRAVENOUS | Status: DC | PRN
  Filled 2017-09-18: qty 5

## 2017-09-18 MED ORDER — LACTATED RINGERS IV SOLN
500.0000 mL | INTRAVENOUS | Status: DC | PRN
  Administered 2017-09-18: 500 mL via INTRAVENOUS

## 2017-09-18 MED ORDER — ACETAMINOPHEN 325 MG PO TABS: 650.0000 mg | ORAL_TABLET | ORAL | Status: DC | PRN

## 2017-09-18 MED ORDER — DIPHENHYDRAMINE HCL 25 MG PO CAPS: 25.0000 mg | ORAL_CAPSULE | Freq: Four times a day (QID) | ORAL | Status: DC | PRN

## 2017-09-18 MED ORDER — LIDOCAINE HCL (PF) 1 % IJ SOLN
30.0000 mL | INTRAMUSCULAR | Status: DC | PRN
  Filled 2017-09-18: qty 30

## 2017-09-18 MED ORDER — LACTATED RINGERS IV SOLN: 500.0000 mL | Freq: Once | INTRAVENOUS | Status: DC

## 2017-09-18 MED ORDER — OXYCODONE-ACETAMINOPHEN 5-325 MG PO TABS: 2.0000 | ORAL_TABLET | ORAL | Status: DC | PRN

## 2017-09-18 MED ORDER — PHENYLEPHRINE 40 MCG/ML (10ML) SYRINGE FOR IV PUSH (FOR BLOOD PRESSURE SUPPORT): 80.0000 ug | PREFILLED_SYRINGE | INTRAVENOUS | Status: DC | PRN

## 2017-09-18 MED ORDER — BENZOCAINE-MENTHOL 20-0.5 % EX AERO
1.0000 "application " | INHALATION_SPRAY | CUTANEOUS | Status: DC | PRN
  Administered 2017-09-20: 1 via TOPICAL
  Filled 2017-09-18 (×2): qty 56

## 2017-09-18 MED ORDER — LIDOCAINE HCL (PF) 1 % IJ SOLN
INTRAMUSCULAR | Status: DC | PRN
  Administered 2017-09-18 (×2): 5 mL via EPIDURAL

## 2017-09-18 MED ORDER — ONDANSETRON HCL 4 MG/2ML IJ SOLN: 4.0000 mg | Freq: Four times a day (QID) | INTRAMUSCULAR | Status: DC | PRN

## 2017-09-18 MED ORDER — ONDANSETRON HCL 4 MG/2ML IJ SOLN: 4.0000 mg | INTRAMUSCULAR | Status: DC | PRN

## 2017-09-18 MED ORDER — FLEET ENEMA 7-19 GM/118ML RE ENEM: 1.0000 | ENEMA | RECTAL | Status: DC | PRN

## 2017-09-18 MED ORDER — ONDANSETRON HCL 4 MG PO TABS: 4.0000 mg | ORAL_TABLET | ORAL | Status: DC | PRN

## 2017-09-18 NOTE — Anesthesia Preprocedure Evaluation (Signed)
Anesthesia Evaluation  Patient identified by MRN, date of birth, ID band Patient awake    Reviewed: Allergy & Precautions, H&P , Patient's Chart, lab work & pertinent test results  Airway Mallampati: II  TM Distance: >3 FB Neck ROM: full    Dental no notable dental hx.    Pulmonary neg pulmonary ROS,    Pulmonary exam normal breath sounds clear to auscultation       Cardiovascular negative cardio ROS Normal cardiovascular exam Rhythm:regular Rate:Normal     Neuro/Psych negative neurological ROS  negative psych ROS   GI/Hepatic negative GI ROS, Neg liver ROS,   Endo/Other  negative endocrine ROS  Renal/GU negative Renal ROS     Musculoskeletal   Abdominal Normal abdominal exam  (+)   Peds  Hematology negative hematology ROS (+)   Anesthesia Other Findings   Reproductive/Obstetrics (+) Pregnancy                             Anesthesia Physical  Anesthesia Plan  ASA: II  Anesthesia Plan: Epidural   Post-op Pain Management:    Induction:   PONV Risk Score and Plan:   Airway Management Planned:   Additional Equipment:   Intra-op Plan:   Post-operative Plan:   Informed Consent: I have reviewed the patients History and Physical, chart, labs and discussed the procedure including the risks, benefits and alternatives for the proposed anesthesia with the patient or authorized representative who has indicated his/her understanding and acceptance.     Plan Discussed with: Anesthesiologist  Anesthesia Plan Comments:         Anesthesia Quick Evaluation

## 2017-09-18 NOTE — MAU Note (Addendum)
Pt water broke around 1230, put pt on monitor and heard decel, turned her on right side. States she was in the office yesterday for DFM. Was 4 cm in office yesterday.

## 2017-09-18 NOTE — H&P (Signed)
32 y.o. 539w1d  G3P1011 comes in c/o LOF.  Otherwise has good fetal movement and no bleeding.  Found to be grossly ruptured.  Past Medical History:  Diagnosis Date  . Medical history non-contributory     Past Surgical History:  Procedure Laterality Date  . NO PAST SURGERIES      OB History  Gravida Para Term Preterm AB Living  3 1 1   1 1   SAB TAB Ectopic Multiple Live Births  1     0 1    # Outcome Date GA Lbr Len/2nd Weight Sex Delivery Anes PTL Lv  3 Current           2 Term 04/15/16 10139w1d 13:05 / 00:40 2.849 kg (6 lb 4.5 oz) M Vag-Spont EPI  LIV  1 SAB               Social History   Socioeconomic History  . Marital status: Married    Spouse name: Not on file  . Number of children: Not on file  . Years of education: Not on file  . Highest education level: Not on file  Social Needs  . Financial resource strain: Not on file  . Food insecurity - worry: Not on file  . Food insecurity - inability: Not on file  . Transportation needs - medical: Not on file  . Transportation needs - non-medical: Not on file  Occupational History  . Not on file  Tobacco Use  . Smoking status: Never Smoker  . Smokeless tobacco: Never Used  Substance and Sexual Activity  . Alcohol use: No    Alcohol/week: 0.0 oz  . Drug use: No  . Sexual activity: Yes  Other Topics Concern  . Not on file  Social History Narrative  . Not on file   Patient has no known allergies.    Prenatal Transfer Tool  Maternal Diabetes: No Genetic Screening: Declined Maternal Ultrasounds/Referrals: Normal Fetal Ultrasounds or other Referrals:  None Maternal Substance Abuse:  No Significant Maternal Medications:  None Significant Maternal Lab Results: Lab values include: Group B Strep negative  Other PNC: uncomplicated.    There were no vitals filed for this visit.  Lungs/Cor:  NAD Abdomen:  soft, gravid Ex:  no cords, erythema SVE:  7/90/-1 FHTs:  Pending review of tracing, per RN reassuring Toco:   Pending tracing   A/P   Admit to L&D for SROM  GBS Neg  Routine care  TiptonALLAHAN, Luther ParodySIDNEY

## 2017-09-18 NOTE — Anesthesia Procedure Notes (Signed)
Epidural Patient location during procedure: OB Start time: 09/18/2017 3:25 PM End time: 09/18/2017 3:30 PM  Staffing Anesthesiologist: Bethena Midgetddono, Altariq Goodall, MD  Preanesthetic Checklist Completed: patient identified, site marked, surgical consent, pre-op evaluation, timeout performed, IV checked, risks and benefits discussed and monitors and equipment checked  Epidural Patient position: sitting Prep: site prepped and draped and DuraPrep Patient monitoring: continuous pulse ox and blood pressure Approach: midline Location: L3-L4 Injection technique: LOR air (styleted)  Needle:  Needle type: Tuohy  Needle gauge: 17 G Needle length: 9 cm and 9 Needle insertion depth: 4 cm Catheter type: closed end flexible Catheter size: 19 Gauge Catheter at skin depth: 10 cm Test dose: negative  Assessment Events: blood not aspirated, injection not painful, no injection resistance, negative IV test and no paresthesia

## 2017-09-18 NOTE — Anesthesia Pain Management Evaluation Note (Signed)
  CRNA Pain Management Visit Note  Patient: Carly [name], 32 y.o., female  "Hello I am a member of the anesthesia team at [facility]. We have an anesthesia team available at all times to provide care throughout the hospital, including epidural management and anesthesia for C-section. I don't know your plan for the delivery whether it a natural birth, water birth, IV sedation, nitrous supplementation, doula or epidural, but we want to meet your pain goals."   1.Was your pain managed to your expectations on prior hospitalizations?   Yes   2.What is your expectation for pain management during this hospitalization?     Epidural  3.How can we help you reach that goal? Epidural  Being placed at time of visit  Record the patient's initial score and the patient's pain goal.   Pain:   Pain Goal:  [facility] wants you to be able to say your pain was always managed very well.  Rica RecordsICKELTON,Chistopher Mangino 09/18/2017

## 2017-09-19 ENCOUNTER — Encounter (HOSPITAL_COMMUNITY): Payer: Self-pay | Admitting: *Deleted

## 2017-09-19 LAB — CBC
HCT: 28.1 % — ABNORMAL LOW (ref 36.0–46.0)
Hemoglobin: 9.5 g/dL — ABNORMAL LOW (ref 12.0–15.0)
MCH: 29 pg (ref 26.0–34.0)
MCHC: 33.8 g/dL (ref 30.0–36.0)
MCV: 85.7 fL (ref 78.0–100.0)
PLATELETS: 195 10*3/uL (ref 150–400)
RBC: 3.28 MIL/uL — AB (ref 3.87–5.11)
RDW: 12.6 % (ref 11.5–15.5)
WBC: 15.9 10*3/uL — AB (ref 4.0–10.5)

## 2017-09-19 LAB — RPR: RPR Ser Ql: NONREACTIVE

## 2017-09-19 NOTE — Progress Notes (Signed)
Patient is eating, ambulating, voiding.  Pain control is good.  Appropriate lochia, no complaints.  Vitals:   09/18/17 1940 09/18/17 2040 09/19/17 0100 09/19/17 0532  BP: 119/79 114/86 116/85 118/80  Pulse: 64 77 75 70  Resp: 18 16 18 18   Temp: 99 F (37.2 C) 98.9 F (37.2 C) 98.5 F (36.9 C) 98.6 F (37 C)  TempSrc: Oral Oral Oral Oral  SpO2:      Weight:      Height:        Fundus firm No swelling of mons Ext: no calf tenderness  Lab Results  Component Value Date   WBC 15.9 (H) 09/19/2017   HGB 9.5 (L) 09/19/2017   HCT 28.1 (L) 09/19/2017   MCV 85.7 09/19/2017   PLT 195 09/19/2017    --/--/O POS (01/05 1431)  A/P Post partum day 1. Postpartum anemia- about 200cc EBL at delivery, Hb this am dropped from 12+ to 9+.  Pt asymptomatic and fundus firm.  Pt reports moderate but not heavy bleeding.  Cont. PNV with Fe  Routine care.  Expect d/c 1/7.    Philip AspenALLAHAN, Kendry Pfarr

## 2017-09-19 NOTE — Anesthesia Postprocedure Evaluation (Signed)
Anesthesia Post Note  Patient: Carly [name]  Procedure(s) Performed: AN AD HOC LABOR EPIDURAL     Anesthesia Post Evaluation  Last Vitals:  Vitals:   09/18/17 1940 09/18/17 2040  BP: 119/79 114/86  Pulse: 64 77  Resp: 18 16  Temp: 37.2 C 37.2 C  SpO2:      Last Pain:  Vitals:   09/18/17 2040  TempSrc: Oral  PainSc: 0-No pain   Pain Goal:                 Mavric Cortright

## 2017-09-19 NOTE — Plan of Care (Signed)
Patient progressed well overnight, void and ambulating well.  Bleeding WNL

## 2017-09-19 NOTE — Lactation Note (Signed)
This note was copied from a baby's chart. Lactation Consultation Note Baby 11 hrs old. BF well. Mom BF in cradle position when LC entered rm. Mom denied pain. Mom has small short shaft compressible nipples. Mom states BF going well.  Mom BF her 1st child for 1 month d/t low milk supply. Discussed post pumping to build milk supply asked mom to ask RN to set up DEBP.  Mom had good body alignment while BF baby.  Newborn behavior dicussed, STS, I&O, cluster feeding, supply and demand. Mom encouraged to feed baby 8-12 times/24 hours and with feeding cues. WH/LC brochure given w/resources, support groups and LC services.  Patient Name: Carly [name]: 09/19/2017 Reason for consult: Initial assessment   Maternal Data    Feeding Feeding Type: Breast Milk  LATCH Score Latch: Grasps breast easily, tongue down, lips flanged, rhythmical sucking.  Audible Swallowing: A few with stimulation  Type of Nipple: Everted at rest and after stimulation  Comfort (Breast/Nipple): Soft / non-tender  Hold (Positioning): No assistance needed to correctly position infant at breast.  LATCH Score: 9  Interventions Interventions: Breast feeding basics reviewed;Breast compression;Adjust position;Skin to skin;Support pillows;Position options;Breast massage;Hand express;Expressed milk;Pre-pump if needed  Lactation Tools Discussed/Used WIC Program: No   Consult Status Consult Status: Follow-up [name]: 09/20/16 Follow-up type: In-patient    Charyl DancerCARVER, Mykel Sponaugle G 09/19/2017, 5:12 AM

## 2017-09-19 NOTE — Anesthesia Postprocedure Evaluation (Signed)
Anesthesia Post Note  Patient: Carly [name]  Procedure(s) Performed: AN AD HOC LABOR EPIDURAL     Patient location during evaluation: Mother Baby Anesthesia Type: Epidural Level of consciousness: awake and alert Pain management: pain level controlled Vital Signs Assessment: post-procedure vital signs reviewed and stable Respiratory status: spontaneous breathing Cardiovascular status: stable Postop Assessment: no headache, patient able to bend at knees, no backache, no apparent nausea or vomiting, adequate PO intake and epidural receding Anesthetic complications: no    Last Vitals:  Vitals:   09/19/17 0100 09/19/17 0532  BP: 116/85 118/80  Pulse: 75 70  Resp: 18 18  Temp: 36.9 C 37 C  SpO2:      Last Pain:  Vitals:   09/19/17 0532  TempSrc: Oral  PainSc: 5    Pain Goal:                 Salome ArntSterling, Carly [name]

## 2017-09-20 MED ORDER — IBUPROFEN 600 MG PO TABS: 600.0000 mg | ORAL_TABLET | Freq: Four times a day (QID) | ORAL | 0 refills | Status: DC

## 2017-09-20 NOTE — Discharge Summary (Signed)
Obstetric Discharge Summary Reason for Admission: rupture of membranes Prenatal Procedures: ultrasound Intrapartum Procedures: spontaneous vaginal delivery Postpartum Procedures: none Complications-Operative and Postpartum: 2nd degree perineal laceration Hemoglobin  Date Value Ref Range Status  09/19/2017 9.5 (L) 12.0 - 15.0 g/dL Final    Comment:    DELTA CHECK NOTED REPEATED TO VERIFY    HCT  Date Value Ref Range Status  09/19/2017 28.1 (L) 36.0 - 46.0 % Final    Physical Exam:  General: alert and cooperative Lochia: appropriate Uterine Fundus: firm   Discharge Diagnoses: Term Pregnancy-delivered  Discharge Information: Date: 09/20/2017 Activity: pelvic rest Diet: routine Medications: PNV and Ibuprofen Condition: stable Instructions: refer to practice specific booklet Discharge to: home Follow-up Information    Philip AspenCallahan, Sidney, DO. Schedule an appointment as soon as possible for a visit in 1 month(s).   Specialty:  Obstetrics and Gynecology Contact information: [address] [place] [id number] [phone]           Newborn Data: Live born female  Birth Weight: 6 lb 13.7 oz (3110 g) APGAR: 9, 9  Newborn Delivery   Birth date/time:  09/18/2017 17:26:00 Delivery type:  Vaginal, Spontaneous     Home with mother.  ANDERSON,MARK E 09/20/2017, 8:29 AM

## 2017-09-20 NOTE — Lactation Note (Signed)
This note was copied from a baby's chart. Lactation Consultation Note  Patient Name: Carly [name] YNWGN'FToday's Date: 09/20/2017 Reason for consult: Follow-up assessment;Infant weight loss   Follow up with mom of 41 hour old infant. Infant with 8 BF for 10-30 minutes, 3 voids and 1 stool in the last 24 hours. Infant weight 6 lb   oz with 7.7 % weight loss since birth. LATCH scores 9. Infant recently took 15 cc formula and is asleep in the crib.   Mom reports her breasts are feeling fuller today. Mom reports she is having nipple pain with initial latch and then improves with feeding. Mom is using coconut oil to nipples. Mom reports infant is BF well.   Mom reports she just started formula due to weight loss. Enc mom to BF infant with feeding cues at least 8-12 x in 24 hours and to give formula only after BF. Enc mom to pump about every 3 hours post BF and to offer infant all pumped EBM prior to giving formula. Reviewed supply and demand and milk coming to volume. Mom has a PIS at home, she would like to rent a DEBP, gave her information to go to gift shop for pump rental.   Reviewed I/O, signs of dehydration in the NICU, Engorgement prevention/treatment and breast milk expression and storage. [facility] Brochure reviewed, mom aware of OP services, BF Support groups and LC phone #.   Inf[facility] and [facility] on Wed. Mom reports she has no further questions/concerns at this time.    Maternal Data Has patient been taught Hand Expression?: Yes Does the patient have breastfeeding experience prior to this delivery?: Yes  Feeding Feeding Type: Breast Fed Length of feed: 10 min  LATCH Score                   Interventions    Lactation Tools Discussed/Used WIC Program: No Pump Review: Setup, frequency, and cleaning;Milk Storage Initiated by:: Reviewed and encouraged every 3 hours post BF   Consult Status Consult Status: Complete Follow-up type: Call as  needed    Ed BlalockSharon S Hice 09/20/2017, 10:32 AM

## 2020-02-05 ENCOUNTER — Ambulatory Visit: Payer: Medicaid Other | Attending: Internal Medicine

## 2020-02-05 DIAGNOSIS — Z23 Encounter for immunization: Secondary | ICD-10-CM

## 2020-02-05 NOTE — Progress Notes (Signed)
   Covid-19 Vaccination Clinic  Name:  Laraya Pestka    MRN: [id number] DOB: 06-22-1986  02/05/2020  Ms. Vice was observed post Covid-19 immunization for 15 minutes without incident. She was provided with Vaccine Information Sheet and instruction to access the V-Safe system.   Ms. Payment was instructed to call 911 with any severe reactions post vaccine: Marland Kitchen Difficulty breathing  . Swelling of face and throat  . A fast heartbeat  . A bad rash all over body  . Dizziness and weakness   Immunizations Administered    Name Date Dose VIS Date Route   Pfizer COVID-19 Vaccine 02/05/2020  4:16 PM 0.3 mL 11/08/2018 Intramuscular   Manufacturer: [employer], [employer]   Lot: [id number]   NDC: [address]-7

## 2023-06-15 DIAGNOSIS — Z419 Encounter for procedure for purposes other than remedying health state, unspecified: Secondary | ICD-10-CM | POA: Diagnosis not present

## 2023-07-16 DIAGNOSIS — Z419 Encounter for procedure for purposes other than remedying health state, unspecified: Secondary | ICD-10-CM | POA: Diagnosis not present

## 2023-08-04 ENCOUNTER — Ambulatory Visit
Admission: EM | Admit: 2023-08-04 | Discharge: 2023-08-04 | Disposition: A | Discharge status: Home / Self Care | Payer: Medicaid Other | Attending: Internal Medicine | Admitting: Internal Medicine

## 2023-08-04 DIAGNOSIS — R051 Acute cough: Secondary | ICD-10-CM

## 2023-08-04 DIAGNOSIS — J029 Acute pharyngitis, unspecified: Secondary | ICD-10-CM

## 2023-08-04 DIAGNOSIS — J069 Acute upper respiratory infection, unspecified: Secondary | ICD-10-CM | POA: Diagnosis not present

## 2023-08-04 LAB — POCT RAPID STREP A (OFFICE): Rapid Strep A Screen: NEGATIVE

## 2023-08-04 LAB — POC COVID19/FLU A&B COMBO
Covid Antigen, POC: NEGATIVE
Influenza A Antigen, POC: NEGATIVE
Influenza B Antigen, POC: NEGATIVE

## 2023-08-04 MED ORDER — PROMETHAZINE-DM 6.25-15 MG/5ML PO SYRP: 5.0000 mL | ORAL_SOLUTION | Freq: Four times a day (QID) | ORAL | 0 refills | Status: DC | PRN

## 2023-08-04 NOTE — ED Provider Notes (Signed)
[facility]    CSN: [id number] Arrival date & time: 08/04/23  7253      History   Chief Complaint No chief complaint on file.   HPI Carly [name] is a 37 y.o. female  presents for evaluation of URI symptoms for 4 days. Patient reports associated symptoms of cough, congestion, sore throat. Denies N/V/D, fever, ear pain, body aches, shortness of breath. Patient does not have a hx of asthma. Patient does not have a history of smoking.  Reports sick contacts via family members.  pt has taken cold medicine OTC for symptoms. Pt has no other concerns at this time.   HPI  Past Medical History:  Diagnosis Date   Medical history non-contributory     Patient Active Problem List   Diagnosis Date Noted   Indication for care or intervention related to labor and delivery 09/18/2017   Pregnancy 04/15/2016    Past Surgical History:  Procedure Laterality Date   NO PAST SURGERIES      OB History     Gravida  3   Para  2   Term  2   Preterm      AB  1   Living  2      SAB  1   IAB      Ectopic      Multiple  0   Live Births  2            Home Medications    Prior to Admission medications   Medication Sig Start Date End Date Taking? Authorizing Provider  promethazine-dextromethorphan (PROMETHAZINE-DM) 6.25-15 MG/5ML syrup Take 5 mLs by mouth 4 (four) times daily as needed for cough. 08/04/23  Yes Radford Pax, NP  ibuprofen (ADVIL,MOTRIN) 600 MG tablet Take 1 tablet (600 mg total) by mouth every 6 (six) hours. 09/20/17   Levi Aland, MD  Prenatal Vit-Fe Fumarate-FA (PRENATAL MULTIVITAMIN) TABS tablet Take 1 tablet by mouth at bedtime.     [provider]    Family History Family History  Problem Relation Age of Onset   Cancer Maternal Grandfather    Heart disease Paternal Grandfather     Social History Social History   Tobacco Use   Smoking status: Never   Smokeless tobacco: Never  Substance Use Topics   Alcohol use: No     Alcohol/week: 0.0 standard drinks of alcohol   Drug use: No     Allergies   Patient has no known allergies.   Review of Systems Review of Systems  HENT:  Positive for congestion and sore throat.   Respiratory:  Positive for cough.      Physical Exam Triage Vital Signs ED Triage Vitals  Encounter Vitals Group     BP 08/04/23 0840 (!) 131/90     Systolic BP Percentile --      Diastolic BP Percentile --      Pulse Rate 08/04/23 0840 81     Resp 08/04/23 0840 16     Temp 08/04/23 0840 98.5 F (36.9 C)     Temp Source 08/04/23 0840 Oral     SpO2 08/04/23 0840 99 %     Weight --      Height --      Head Circumference --      Peak Flow --      Pain Score 08/04/23 0839 3     Pain Loc --      Pain Education --  Exclude from Growth Chart --    No data found.  Updated Vital Signs BP (!) 131/90 (BP Location: Right Arm)   Pulse 81   Temp 98.5 F (36.9 C) (Oral)   Resp 16   LMP 08/01/2023 (Approximate)   SpO2 99%   Visual Acuity Right Eye Distance:   Left Eye Distance:   Bilateral Distance:    Right Eye Near:   Left Eye Near:    Bilateral Near:     Physical Exam Vitals and nursing note reviewed.  Constitutional:      General: She is not in acute distress.    Appearance: She is well-developed. She is not ill-appearing.  HENT:     Head: Normocephalic and atraumatic.     Right Ear: Tympanic membrane and ear canal normal.     Left Ear: Tympanic membrane and ear canal normal.     Nose: Congestion present.     Mouth/Throat:     Mouth: Mucous membranes are moist.     Pharynx: Oropharynx is clear. Uvula midline. Posterior oropharyngeal erythema present.     Tonsils: No tonsillar exudate or tonsillar abscesses.  Eyes:     Conjunctiva/sclera: Conjunctivae normal.     Pupils: Pupils are equal, round, and reactive to light.  Cardiovascular:     Rate and Rhythm: Normal rate and regular rhythm.     Heart sounds: Normal heart sounds.  Pulmonary:     Effort:  Pulmonary effort is normal.     Breath sounds: Normal breath sounds.  Musculoskeletal:     Cervical back: Normal range of motion and neck supple.  Lymphadenopathy:     Cervical: No cervical adenopathy.  Skin:    General: Skin is warm and dry.  Neurological:     General: No focal deficit present.     Mental Status: She is alert and oriented to person, place, and time.  Psychiatric:        Mood and Affect: Mood normal.        Behavior: Behavior normal.      UC Treatments / Results  Labs (all labs ordered are listed, but only abnormal results are displayed) Labs Reviewed  CULTURE, GROUP A STREP [facility])  POCT RAPID STREP A (OFFICE)  POC COVID19/FLU A&B COMBO    EKG   Radiology No results found.  Procedures Procedures (including critical care time)  Medications Ordered in UC Medications - No data to display  Initial Impression / Assessment and Plan / UC Course  I have reviewed the triage vital signs and the nursing notes.  Pertinent labs & imaging results that were available during my care of the patient were reviewed by me and considered in my medical decision making (see chart for details).     Reviewed exam and symptoms with patient.  No red flags.  Negative rapid strep, flu, COVID testing.  Will send throat culture.  Discussed viral illness and symptomatic treatment.  Promethazine DM as needed for cough.  PCP follow-up if symptoms do not improve.  ER precautions reviewed. Final Clinical Impressions(s) / UC Diagnoses   Final diagnoses:  Sore throat  Acute cough  Viral upper respiratory illness     Discharge Instructions      You may take Promethazine DM as needed for cough.  Please note this medication can make you drowsy.  Do not drink alcohol or drive while on this medication.  Lots of rest and fluids.  Please follow-up with your PCP if your symptoms do not improve.  Please go to the ER if you develop any worsening symptoms.  I hope you feel better  soon!     ED Prescriptions     Medication Sig Dispense Auth. Provider   promethazine-dextromethorphan (PROMETHAZINE-DM) 6.25-15 MG/5ML syrup Take 5 mLs by mouth 4 (four) times daily as needed for cough. 118 mL Radford Pax, NP      PDMP not reviewed this encounter.   Radford Pax, NP 08/04/23 [phone]

## 2023-08-04 NOTE — ED Triage Notes (Signed)
Pt presents to UC w/ c/o sore throat, coughing x4 days. Pt states she has been taking cold medicine.

## 2023-08-04 NOTE — Discharge Instructions (Signed)
You may take Promethazine DM as needed for cough.  Please note this medication can make you drowsy.  Do not drink alcohol or drive while on this medication.  Lots of rest and fluids.  Please follow-up with your PCP if your symptoms do not improve.  Please go to the ER if you develop any worsening symptoms.  I hope you feel better soon!

## 2023-08-08 LAB — CULTURE, GROUP A STREP (THRC)

## 2023-08-15 DIAGNOSIS — Z419 Encounter for procedure for purposes other than remedying health state, unspecified: Secondary | ICD-10-CM | POA: Diagnosis not present

## 2023-09-15 DIAGNOSIS — Z419 Encounter for procedure for purposes other than remedying health state, unspecified: Secondary | ICD-10-CM | POA: Diagnosis not present

## 2023-10-16 DIAGNOSIS — Z419 Encounter for procedure for purposes other than remedying health state, unspecified: Secondary | ICD-10-CM | POA: Diagnosis not present

## 2023-10-21 ENCOUNTER — Ambulatory Visit
Admission: EM | Admit: 2023-10-21 | Discharge: 2023-10-21 | Disposition: A | Discharge status: Home / Self Care | Payer: Medicaid Other | Attending: Family Medicine | Admitting: Family Medicine

## 2023-10-21 DIAGNOSIS — B349 Viral infection, unspecified: Secondary | ICD-10-CM | POA: Diagnosis not present

## 2023-10-21 MED ORDER — IBUPROFEN 600 MG PO TABS: 600.0000 mg | ORAL_TABLET | Freq: Four times a day (QID) | ORAL | 0 refills | Status: AC | PRN

## 2023-10-21 MED ORDER — PROMETHAZINE-DM 6.25-15 MG/5ML PO SYRP: 5.0000 mL | ORAL_SOLUTION | Freq: Three times a day (TID) | ORAL | 0 refills | Status: AC | PRN

## 2023-10-21 MED ORDER — CETIRIZINE HCL 10 MG PO TABS: 10.0000 mg | ORAL_TABLET | Freq: Every day | ORAL | 0 refills | Status: AC

## 2023-10-21 MED ORDER — PSEUDOEPHEDRINE HCL 60 MG PO TABS: 60.0000 mg | ORAL_TABLET | Freq: Three times a day (TID) | ORAL | 0 refills | Status: AC | PRN

## 2023-10-21 NOTE — ED Triage Notes (Signed)
 Pt reports shortness of breath, chest discomfort when standing up or moving around, cough, chest congestion x 3 days. Pt is concern fro pneumonia. Dayquil gives no relief.

## 2023-10-21 NOTE — Discharge Instructions (Signed)
 We will manage this as a viral illness. For sore throat or cough try using a honey-based tea. Use 3 teaspoons of honey with juice squeezed from half lemon. Place shaved pieces of ginger into 1/2-1 cup of water and warm over stove top. Then mix the ingredients and repeat every 4 hours as needed. Please take ibuprofen 600mg  every 6 hours with food alternating with OR taken together with Tylenol 500mg -650mg  every 6 hours for throat pain, fevers, aches and pains. Hydrate very well with at least 2 liters of water. Eat light meals such as soups (chicken and noodles, vegetable, chicken and wild rice).  Do not eat foods that you are allergic to.  Taking an antihistamine like Zyrtec (10mg  daily) can help against postnasal drainage, sinus congestion which can cause sinus pain, sinus headaches, throat pain, painful swallowing, coughing.  You can take this together with pseudoephedrine (Sudafed) at a dose of 60 mg 3 times a day or twice daily as needed for the same kind of nasal drip, congestion.  Use cough syrup as needed.

## 2023-10-21 NOTE — ED Provider Notes (Signed)
 [facility]  Note:  This document was prepared using Conservation officer, historic buildings and may include unintentional dictation errors.  MRN: [id number] DOB: 1986/07/02  Subjective:   Carly [name] is a 38 y.o. female presenting for 3-day history of persistent malaise, fatigue, body pains, fevers.  The symptoms have improved but patient continues to have coughing, chest congestion, wants to make sure she does not have pneumonia.  No asthma.  She is not currently taking any medications and has no known food or drug allergies.  Denies past medical and surgical history.    Family History  Problem Relation Age of Onset   Cancer Maternal Grandfather    Heart disease Paternal Grandfather     Social History   Tobacco Use   Smoking status: Never   Smokeless tobacco: Never  Substance Use Topics   Alcohol use: No    Alcohol/week: 0.0 standard drinks of alcohol   Drug use: No    ROS   Objective:   Vitals: BP 123/80 (BP Location: Left Arm)   Pulse 87   Temp 98.4 F (36.9 C) (Oral)   Resp 18   LMP 09/29/2023 (Approximate)   SpO2 98%   Breastfeeding No   Physical Exam Constitutional:      General: She is not in acute distress.    Appearance: Normal appearance. She is well-developed and normal weight. She is not ill-appearing, toxic-appearing or diaphoretic.  HENT:     Head: Normocephalic and atraumatic.     Right Ear: Tympanic membrane, ear canal and external ear normal. No drainage or tenderness. No middle ear effusion. There is no impacted cerumen. Tympanic membrane is not erythematous or bulging.     Left Ear: Tympanic membrane, ear canal and external ear normal. No drainage or tenderness.  No middle ear effusion. There is no impacted cerumen. Tympanic membrane is not erythematous or bulging.     Nose: Nose normal. No congestion or rhinorrhea.     Mouth/Throat:     Mouth: Mucous membranes are moist. No oral lesions.     Pharynx: No pharyngeal  swelling, oropharyngeal exudate, posterior oropharyngeal erythema or uvula swelling.     Tonsils: No tonsillar exudate or tonsillar abscesses.  Eyes:     General: No scleral icterus.       Right eye: No discharge.        Left eye: No discharge.     Extraocular Movements: Extraocular movements intact.     Right eye: Normal extraocular motion.     Left eye: Normal extraocular motion.     Conjunctiva/sclera: Conjunctivae normal.  Cardiovascular:     Rate and Rhythm: Normal rate and regular rhythm.     Heart sounds: Normal heart sounds. No murmur heard.    No friction rub. No gallop.  Pulmonary:     Effort: Pulmonary effort is normal. No respiratory distress.     Breath sounds: No stridor. No wheezing, rhonchi or rales.  Chest:     Chest wall: No tenderness.  Musculoskeletal:     Cervical back: Normal range of motion and neck supple.  Lymphadenopathy:     Cervical: No cervical adenopathy.  Skin:    General: Skin is warm and dry.  Neurological:     General: No focal deficit present.     Mental Status: She is alert and oriented to person, place, and time.  Psychiatric:        Mood and Affect: Mood normal.  Behavior: Behavior normal.     Assessment and Plan :   PDMP not reviewed this encounter.  1. Acute viral syndrome    Offered COVID and flu testing, chest x-ray but patient declined.  Physical exam findings reassuring, no adventitious lung sounds.  Suspect viral URI, viral syndrome. Physical exam findings reassuring and vital signs stable for discharge. Advised supportive care, offered symptomatic relief. Counseled patient on potential for adverse effects with medications prescribed/recommended today, ER and return-to-clinic precautions discussed, patient verbalized understanding.     Christopher Savannah, PA-C 10/21/23 2001

## 2023-11-13 DIAGNOSIS — Z419 Encounter for procedure for purposes other than remedying health state, unspecified: Secondary | ICD-10-CM | POA: Diagnosis not present

## 2023-12-25 DIAGNOSIS — Z419 Encounter for procedure for purposes other than remedying health state, unspecified: Secondary | ICD-10-CM | POA: Diagnosis not present

## 2024-01-24 DIAGNOSIS — Z419 Encounter for procedure for purposes other than remedying health state, unspecified: Secondary | ICD-10-CM | POA: Diagnosis not present

## 2024-02-24 DIAGNOSIS — Z419 Encounter for procedure for purposes other than remedying health state, unspecified: Secondary | ICD-10-CM | POA: Diagnosis not present

## 2024-03-25 DIAGNOSIS — Z419 Encounter for procedure for purposes other than remedying health state, unspecified: Secondary | ICD-10-CM | POA: Diagnosis not present

## 2024-04-25 DIAGNOSIS — Z419 Encounter for procedure for purposes other than remedying health state, unspecified: Secondary | ICD-10-CM | POA: Diagnosis not present

## 2024-05-26 DIAGNOSIS — Z419 Encounter for procedure for purposes other than remedying health state, unspecified: Secondary | ICD-10-CM | POA: Diagnosis not present

## 2024-06-01 ENCOUNTER — Ambulatory Visit
Admission: EM | Admit: 2024-06-01 | Discharge: 2024-06-01 | Disposition: A | Discharge status: Home / Self Care | Attending: Family Medicine | Admitting: Family Medicine

## 2024-06-01 DIAGNOSIS — N760 Acute vaginitis: Secondary | ICD-10-CM | POA: Diagnosis not present

## 2024-06-01 MED ORDER — FLUCONAZOLE 150 MG PO TABS: 150.0000 mg | ORAL_TABLET | ORAL | 0 refills | Status: AC

## 2024-06-01 NOTE — Discharge Instructions (Addendum)
 Go ahead and start fluconazole  to address a suspected yeast infection at your request. We will update your test results tomorrow.

## 2024-06-01 NOTE — ED Provider Notes (Signed)
 [facility]  Note:  This document was prepared using Conservation officer, historic buildings and may include unintentional dictation errors.  MRN: [id number] DOB: 12/15/1985  Subjective:   Carly [name] is a 38 y.o. female presenting for 2 day history of vaginal itching and irritation. Has normal discharge. Denies fever, n/v, abdominal pain, pelvic pain, rashes, dysuria, urinary frequency, hematuria, vaginal discharge.    No current facility-administered medications for this encounter.  Current Outpatient Medications:    cetirizine  (ZYRTEC  ALLERGY) 10 MG tablet, Take 1 tablet (10 mg total) by mouth daily., Disp: 30 tablet, Rfl: 0   ibuprofen  (ADVIL ) 600 MG tablet, Take 1 tablet (600 mg total) by mouth every 6 (six) hours as needed., Disp: 30 tablet, Rfl: 0   Prenatal Vit-Fe Fumarate-FA (PRENATAL MULTIVITAMIN) TABS tablet, Take 1 tablet by mouth at bedtime. , Disp: , Rfl:    promethazine -dextromethorphan (PROMETHAZINE -DM) 6.25-15 MG/5ML syrup, Take 5 mLs by mouth 3 (three) times daily as needed for cough., Disp: 200 mL, Rfl: 0   pseudoephedrine  (SUDAFED) 60 MG tablet, Take 1 tablet (60 mg total) by mouth every 8 (eight) hours as needed for congestion., Disp: 30 tablet, Rfl: 0   Pseudoephedrine -APAP-DM (DAYQUIL PO), Take by mouth., Disp: , Rfl:    No Known Allergies  Past Medical History:  Diagnosis Date   Medical history non-contributory      Past Surgical History:  Procedure Laterality Date   NO PAST SURGERIES      Family History  Problem Relation Age of Onset   Cancer Maternal Grandfather    Heart disease Paternal Grandfather     Social History   Tobacco Use   Smoking status: Never   Smokeless tobacco: Never  Vaping Use   Vaping status: Never Used  Substance Use Topics   Alcohol use: Never   Drug use: Never    ROS   Objective:   Vitals: BP (!) 145/95 (BP Location: Right Arm)   Pulse 75   Temp 98.8 F (37.1 C) (Oral)   Resp 17   LMP   (Within Months) Comment: 1 month  SpO2 98%   Physical Exam Constitutional:      General: She is not in acute distress.    Appearance: Normal appearance. She is well-developed. She is not ill-appearing, toxic-appearing or diaphoretic.  HENT:     Head: Normocephalic and atraumatic.     Nose: Nose normal.     Mouth/Throat:     Mouth: Mucous membranes are moist.  Eyes:     General: No scleral icterus.       Right eye: No discharge.        Left eye: No discharge.     Extraocular Movements: Extraocular movements intact.     Conjunctiva/sclera: Conjunctivae normal.  Cardiovascular:     Rate and Rhythm: Normal rate.  Pulmonary:     Effort: Pulmonary effort is normal.  Abdominal:     General: Bowel sounds are normal. There is no distension.     Palpations: Abdomen is soft. There is no mass.     Tenderness: There is no abdominal tenderness. There is no right CVA tenderness, left CVA tenderness, guarding or rebound.  Skin:    General: Skin is warm and dry.  Neurological:     General: No focal deficit present.     Mental Status: She is alert and oriented to person, place, and time.  Psychiatric:        Mood and Affect: Mood normal.  Behavior: Behavior normal.        Thought Content: Thought content normal.        Judgment: Judgment normal.     Assessment and Plan :   PDMP not reviewed this encounter.  1. Acute vaginitis    We will treat patient empirically for yeast vaginitis with fluconazole .  Labs pending. Counseled patient on potential for adverse effects with medications prescribed/recommended today, ER and return-to-clinic precautions discussed, patient verbalized understanding.    Christopher Savannah, [place] 06/01/24 1929

## 2024-06-01 NOTE — ED Triage Notes (Signed)
 Pt reports vaginal itching x 2 days. Pt tried warm water with salt without relief.

## 2024-06-02 LAB — CERVICOVAGINAL ANCILLARY ONLY
Bacterial Vaginitis (gardnerella): POSITIVE — AB
Candida Glabrata: NEGATIVE
Candida Vaginitis: POSITIVE — AB
Comment: NEGATIVE
Comment: NEGATIVE
Comment: NEGATIVE

## 2024-06-05 ENCOUNTER — Ambulatory Visit (HOSPITAL_COMMUNITY): Payer: Self-pay

## 2024-06-05 MED ORDER — METRONIDAZOLE 500 MG PO TABS: 500.0000 mg | ORAL_TABLET | Freq: Two times a day (BID) | ORAL | 0 refills | Status: AC

## 2024-07-10 DIAGNOSIS — B3731 Acute candidiasis of vulva and vagina: Secondary | ICD-10-CM | POA: Diagnosis not present

## 2024-07-10 DIAGNOSIS — N898 Other specified noninflammatory disorders of vagina: Secondary | ICD-10-CM | POA: Diagnosis not present

## 2024-07-10 DIAGNOSIS — R300 Dysuria: Secondary | ICD-10-CM | POA: Diagnosis not present

## 2024-07-10 DIAGNOSIS — N926 Irregular menstruation, unspecified: Secondary | ICD-10-CM | POA: Diagnosis not present

## 2024-07-27 DIAGNOSIS — B3731 Acute candidiasis of vulva and vagina: Secondary | ICD-10-CM | POA: Diagnosis not present

## 2024-07-27 DIAGNOSIS — Z7689 Persons encountering health services in other specified circumstances: Secondary | ICD-10-CM | POA: Diagnosis not present

## 2024-07-27 DIAGNOSIS — Z13220 Encounter for screening for lipoid disorders: Secondary | ICD-10-CM | POA: Diagnosis not present

## 2024-07-27 DIAGNOSIS — Z0189 Encounter for other specified special examinations: Secondary | ICD-10-CM | POA: Diagnosis not present

## 2024-08-17 ENCOUNTER — Ambulatory Visit: Admitting: Nurse Practitioner

## 2024-08-25 DIAGNOSIS — Z419 Encounter for procedure for purposes other than remedying health state, unspecified: Secondary | ICD-10-CM | POA: Diagnosis not present

## 2024-08-31 DIAGNOSIS — R030 Elevated blood-pressure reading, without diagnosis of hypertension: Secondary | ICD-10-CM | POA: Diagnosis not present

## 2024-08-31 DIAGNOSIS — Z0000 Encounter for general adult medical examination without abnormal findings: Secondary | ICD-10-CM | POA: Diagnosis not present

## 2024-08-31 DIAGNOSIS — Z1231 Encounter for screening mammogram for malignant neoplasm of breast: Secondary | ICD-10-CM | POA: Diagnosis not present

## 2024-08-31 DIAGNOSIS — Z124 Encounter for screening for malignant neoplasm of cervix: Secondary | ICD-10-CM | POA: Diagnosis not present
